# Patient Record
Sex: Male | Born: 1970 | Race: White | Hispanic: No | Marital: Single | Smoking: Former smoker
Health system: Southern US, Community
[De-identification: ages and names within clinical notes are randomized; demographics above are authoritative.]

## PROBLEM LIST (undated history)

## (undated) DIAGNOSIS — E785 Hyperlipidemia, unspecified: Secondary | ICD-10-CM

## (undated) HISTORY — PX: HEMORRHOID SURGERY: SHX153

## (undated) HISTORY — DX: Hyperlipidemia, unspecified: E78.5

## (undated) MED FILL — Dulaglutide Soln Auto-injector 3 MG/0.5ML: SUBCUTANEOUS | Fill #3 | Status: CN

---

## 2011-11-28 LAB — LIPID PANEL: Cholesterol: 196 mg/dL (ref 0–200)

## 2012-09-10 ENCOUNTER — Encounter: Payer: Self-pay | Admitting: Sports Medicine

## 2012-09-10 ENCOUNTER — Ambulatory Visit (INDEPENDENT_AMBULATORY_CARE_PROVIDER_SITE_OTHER): Payer: Managed Care, Other (non HMO) | Admitting: Sports Medicine

## 2012-09-10 VITALS — BP 133/87 | HR 92 | Temp 98.3°F | Wt 232.0 lb

## 2012-09-10 DIAGNOSIS — R059 Cough, unspecified: Secondary | ICD-10-CM

## 2012-09-10 DIAGNOSIS — Z0000 Encounter for general adult medical examination without abnormal findings: Secondary | ICD-10-CM | POA: Insufficient documentation

## 2012-09-10 DIAGNOSIS — Z299 Encounter for prophylactic measures, unspecified: Secondary | ICD-10-CM

## 2012-09-10 DIAGNOSIS — J111 Influenza due to unidentified influenza virus with other respiratory manifestations: Secondary | ICD-10-CM | POA: Insufficient documentation

## 2012-09-10 DIAGNOSIS — R509 Fever, unspecified: Secondary | ICD-10-CM

## 2012-09-10 DIAGNOSIS — R6889 Other general symptoms and signs: Secondary | ICD-10-CM

## 2012-09-10 DIAGNOSIS — R05 Cough: Secondary | ICD-10-CM

## 2012-09-10 LAB — POCT INFLUENZA A/B
Influenza A, POC: NEGATIVE
Influenza B, POC: NEGATIVE

## 2012-09-10 MED ORDER — MELOXICAM 15 MG PO TABS: ORAL_TABLET | ORAL | Status: DC

## 2012-09-10 MED ORDER — HYDROCOD POLST-CHLORPHEN POLST 10-8 MG/5ML PO LQCR: 5.0000 mL | Freq: Two times a day (BID) | ORAL | Status: DC | PRN

## 2012-09-10 NOTE — Assessment & Plan Note (Signed)
Negative rapid flu test and out of the window for Tamiflu. Treatment is symptomatic, no signs of pneumonia or sinusitis. Tussionex and Mobic Return to clinic on an as-needed basis.

## 2012-09-10 NOTE — Progress Notes (Signed)
Subjective:    CC: Establish care.   HPI:  Cough: Present for 3-4 days, nonproductive, associated with fevers up to 101, chills, and diffuse muscle and backache. He denies any sick contacts. No facial or sinus pressure, no shortness of breath, no sore throat, no GI symptoms, no rash. Cough does keep him up at night. He did get a flu shot this year.  Preventive measure: Needed biometric screening as well as some blood work done for work. He desires to wait until March and do this with his complete physical.  Past medical history, Surgical history, Family history, Social history, Allergies, and medications have been entered into the medical record, reviewed, and no changes needed.   Review of Systems: No headache, visual changes, nausea, vomiting, diarrhea, constipation, dizziness, abdominal pain, skin rash, fevers, chills, night sweats, swollen lymph nodes, weight loss, chest pain, body aches, joint swelling, muscle aches, shortness of breath, mood changes, visual or auditory hallucinations.  Objective:    General: Well Developed, well nourished, and in no acute distress.  Neuro: Alert and oriented x3, extra-ocular muscles intact, sensation grossly intact.  HEENT: Normocephalic, atraumatic, pupils equal round reactive to light, neck supple, no masses, no lymphadenopathy, thyroid nonpalpable. Oropharynx, nasopharynx, external ear canals unremarkable to inspection. No sinus tenderness to palpation. Skin: Warm and dry, no rashes noted.  Cardiac: Regular rate and rhythm, no murmurs rubs or gallops.  Respiratory: Clear to auscultation bilaterally. Not using accessory muscles, speaking in full sentences.  Abdominal: Soft, nontender, nondistended, positive bowel sounds, no masses, no organomegaly.  Musculoskeletal: Shoulder, elbow, wrist, hip, knee, ankle stable, and with full range of motion.  Impression and Recommendations:    The patient was counselled, risk factors were discussed, anticipatory  guidance given.

## 2012-09-10 NOTE — Assessment & Plan Note (Signed)
He needs a physical in March, as well as blood work. I will go ahead and give him the lab ticket he will get his blood work done, and come in for a physical in March.

## 2012-09-23 ENCOUNTER — Encounter: Payer: Self-pay | Admitting: *Deleted

## 2012-09-28 ENCOUNTER — Encounter: Payer: Self-pay | Admitting: Sports Medicine

## 2012-09-28 ENCOUNTER — Ambulatory Visit (INDEPENDENT_AMBULATORY_CARE_PROVIDER_SITE_OTHER): Payer: Managed Care, Other (non HMO) | Admitting: Sports Medicine

## 2012-09-28 ENCOUNTER — Ambulatory Visit (INDEPENDENT_AMBULATORY_CARE_PROVIDER_SITE_OTHER): Payer: Managed Care, Other (non HMO)

## 2012-09-28 VITALS — BP 132/78 | HR 107 | Temp 98.6°F | Wt 229.0 lb

## 2012-09-28 DIAGNOSIS — R5381 Other malaise: Secondary | ICD-10-CM

## 2012-09-28 DIAGNOSIS — R05 Cough: Secondary | ICD-10-CM

## 2012-09-28 DIAGNOSIS — R5383 Other fatigue: Secondary | ICD-10-CM

## 2012-09-28 DIAGNOSIS — J111 Influenza due to unidentified influenza virus with other respiratory manifestations: Secondary | ICD-10-CM

## 2012-09-28 DIAGNOSIS — R059 Cough, unspecified: Secondary | ICD-10-CM

## 2012-09-28 MED ORDER — AZITHROMYCIN 250 MG PO TABS: ORAL_TABLET | ORAL | Status: DC

## 2012-09-28 MED ORDER — HYDROCOD POLST-CHLORPHEN POLST 10-8 MG/5ML PO LQCR: 5.0000 mL | Freq: Two times a day (BID) | ORAL | Status: DC | PRN

## 2012-09-28 NOTE — Progress Notes (Signed)
Subjective:    CC: Followup  HPI: I last saw Kristopher Snyder a few weeks ago for influenza-like illness. Unfortunately he was outside of the window for Tamiflu, symmetric and symptomatically with cough medication and analgesics. He returns today significantly improved, but unfortunately still with a cough, occasionally productive of sputum but no hemoptysis. He still is slightly fatigued. He denies any shortness of breath, fevers, chills, nausea, vomiting, or diarrhea. He also denies rash. Symptoms are improving and mild.  Past medical history, Surgical history, Family history not pertinant except as noted below, Social history, Allergies, and medications have been entered into the medical record, reviewed, and no changes needed.   Review of Systems: No fevers, chills, night sweats, weight loss, chest pain, or shortness of breath.   Objective:    General: Well Developed, well nourished, and in no acute distress.  Neuro: Alert and oriented x3, extra-ocular muscles intact, sensation grossly intact.  HEENT: Normocephalic, atraumatic, pupils equal round reactive to light, neck supple, no masses, no lymphadenopathy, thyroid nonpalpable.  Skin: Warm and dry, no rashes. Cardiac: Regular rate and rhythm, no murmurs rubs or gallops.  Respiratory: Clear to auscultation bilaterally. Not using accessory muscles, speaking in full sentences.  Impression and Recommendations:

## 2012-09-28 NOTE — Assessment & Plan Note (Addendum)
FMLA paperwork filled out. Azithromycin, chest x-ray. Refilling Tussionex.

## 2012-11-24 ENCOUNTER — Other Ambulatory Visit: Payer: Self-pay | Admitting: Sports Medicine

## 2012-11-24 ENCOUNTER — Telehealth: Payer: Self-pay | Admitting: *Deleted

## 2012-11-24 DIAGNOSIS — E785 Hyperlipidemia, unspecified: Secondary | ICD-10-CM | POA: Insufficient documentation

## 2012-11-24 DIAGNOSIS — R7401 Elevation of levels of liver transaminase levels: Secondary | ICD-10-CM | POA: Insufficient documentation

## 2012-11-24 HISTORY — DX: Hyperlipidemia, unspecified: E78.5

## 2012-11-24 LAB — LIPID PANEL
Cholesterol: 203 mg/dL — ABNORMAL HIGH (ref 0–200)
HDL: 44 mg/dL (ref >39)
LDL Cholesterol: 130 mg/dL — ABNORMAL HIGH (ref 0–99)
Total CHOL/HDL Ratio: 4.6 ratio
Triglycerides: 145 mg/dL (ref <150)
VLDL: 29 mg/dL (ref 0–40)

## 2012-11-24 LAB — TESTOSTERONE, FREE, TOTAL, SHBG
Sex Hormone Binding: 27 nmol/L (ref 13–71)
Testosterone, Free: 80.3 pg/mL (ref 47.0–244.0)
Testosterone-% Free: 2.2 % (ref 1.6–2.9)
Testosterone: 358 ng/dL (ref 300–890)

## 2012-11-24 LAB — TSH: TSH: 0.633 u[IU]/mL (ref 0.350–4.500)

## 2012-11-24 LAB — COMPREHENSIVE METABOLIC PANEL
ALT: 162 U/L — ABNORMAL HIGH (ref 0–53)
Albumin: 4.1 g/dL (ref 3.5–5.2)
BUN: 17 mg/dL (ref 6–23)
CO2: 28 mEq/L (ref 19–32)
Chloride: 102 mEq/L (ref 96–112)
Creat: 0.88 mg/dL (ref 0.50–1.35)
Total Protein: 7.1 g/dL (ref 6.0–8.3)

## 2012-11-24 LAB — COMPREHENSIVE METABOLIC PANEL WITH GFR
AST: 83 U/L — ABNORMAL HIGH (ref 0–37)
Alkaline Phosphatase: 117 U/L (ref 39–117)
Calcium: 9 mg/dL (ref 8.4–10.5)
Glucose, Bld: 82 mg/dL (ref 70–99)
Potassium: 3.7 meq/L (ref 3.5–5.3)
Sodium: 138 meq/L (ref 135–145)
Total Bilirubin: 0.6 mg/dL (ref 0.3–1.2)

## 2012-11-24 LAB — CBC
HCT: 42.6 % (ref 39.0–52.0)
Hemoglobin: 14.5 g/dL (ref 13.0–17.0)
MCH: 29.7 pg (ref 26.0–34.0)
MCHC: 34 g/dL (ref 30.0–36.0)
MCV: 87.3 fL (ref 78.0–100.0)
Platelets: 187 K/uL (ref 150–400)
RBC: 4.88 MIL/uL (ref 4.22–5.81)
RDW: 13.4 % (ref 11.5–15.5)
WBC: 6.6 10*3/uL (ref 4.0–10.5)

## 2012-11-24 NOTE — Telephone Encounter (Signed)
Message copied by Edilia Bo on Tue Nov 24, 2012 11:55 AM ------      Message from: Monica Becton      Created: Tue Nov 24, 2012  8:38 AM       Labs look good, cholesterol is slightly up and liver function is elevated, need to recheck LFTs.  If still elevated need to check hepatitis titers.  Was any alcohol consumed before the labwork? ------

## 2012-11-25 ENCOUNTER — Ambulatory Visit (INDEPENDENT_AMBULATORY_CARE_PROVIDER_SITE_OTHER): Payer: BC Managed Care – PPO | Admitting: Sports Medicine

## 2012-11-25 ENCOUNTER — Encounter: Payer: Self-pay | Admitting: Sports Medicine

## 2012-11-25 VITALS — BP 136/89 | HR 75 | Ht 73.0 in | Wt 234.0 lb

## 2012-11-25 DIAGNOSIS — Z0000 Encounter for general adult medical examination without abnormal findings: Secondary | ICD-10-CM

## 2012-11-25 DIAGNOSIS — R7401 Elevation of levels of liver transaminase levels: Secondary | ICD-10-CM

## 2012-11-25 DIAGNOSIS — Z299 Encounter for prophylactic measures, unspecified: Secondary | ICD-10-CM

## 2012-11-25 DIAGNOSIS — E785 Hyperlipidemia, unspecified: Secondary | ICD-10-CM

## 2012-11-25 DIAGNOSIS — E669 Obesity, unspecified: Secondary | ICD-10-CM | POA: Insufficient documentation

## 2012-11-25 NOTE — Assessment & Plan Note (Signed)
He will work extensively on diet and exercise. We will recheck in 3 months, orders placed.

## 2012-11-25 NOTE — Progress Notes (Signed)
  Subjective:    CC: CPE  HPI:  Kristopher Snyder comes in for complete physical exam, biometry screening for work. He also comes in to go over blood work. He did have some mild transaminitis, he is seen a hepatologist in the past who recommended liver biopsy which he declined. He declines any further evaluation, her workup for this. He denies any heavy alcohol intake, he did say that he had a drink before blood work.  Past medical history, Surgical history, Family history not pertinant except as noted below, Social history, Allergies, and medications have been entered into the medical record, reviewed, and no changes needed.   Review of Systems: No headache, visual changes, nausea, vomiting, diarrhea, constipation, dizziness, abdominal pain, skin rash, fevers, chills, night sweats, swollen lymph nodes, weight loss, chest pain, body aches, joint swelling, muscle aches, shortness of breath, mood changes, visual or auditory hallucinations.  Objective:    General: Well Developed, well nourished, and in no acute distress.  Neuro: Alert and oriented x3, extra-ocular muscles intact, sensation grossly intact.  HEENT: Normocephalic, atraumatic, pupils equal round reactive to light, neck supple, no masses, no lymphadenopathy, thyroid nonpalpable. Left tympanic membrane occluded by cerumen, oropharynx, nasopharynx unremarkable. Skin: Warm and dry, no rashes noted.  Cardiac: Regular rate and rhythm, no murmurs rubs or gallops. No carotid bruits. No lower extremity edema. Respiratory: Clear to auscultation bilaterally. Not using accessory muscles, speaking in full sentences.  Abdominal: Soft, nontender, nondistended, positive bowel sounds, no masses, no organomegaly.  Musculoskeletal: Shoulder, elbow, wrist, hip, knee, ankle stable, and with full range of motion. Impression and Recommendations:    The patient was counselled, risk factors were discussed, anticipatory guidance given.

## 2012-11-25 NOTE — Assessment & Plan Note (Signed)
CPE performed today. 

## 2012-11-25 NOTE — Patient Instructions (Addendum)

## 2012-11-25 NOTE — Assessment & Plan Note (Signed)
We will work extensively on dieting and weight loss strategies. Handout given.

## 2012-11-25 NOTE — Assessment & Plan Note (Signed)
Patient declines any further workup for this. Hepatology did recommend biopsy in the past.

## 2012-12-15 ENCOUNTER — Other Ambulatory Visit: Payer: Self-pay | Admitting: Sports Medicine

## 2013-01-04 ENCOUNTER — Other Ambulatory Visit: Payer: Self-pay | Admitting: Sports Medicine

## 2013-02-03 ENCOUNTER — Ambulatory Visit (INDEPENDENT_AMBULATORY_CARE_PROVIDER_SITE_OTHER): Payer: BC Managed Care – PPO | Admitting: Family Medicine

## 2013-02-03 ENCOUNTER — Encounter: Payer: Self-pay | Admitting: Family Medicine

## 2013-02-03 VITALS — BP 125/78 | HR 97 | Temp 97.9°F | Wt 227.0 lb

## 2013-02-03 DIAGNOSIS — J209 Acute bronchitis, unspecified: Secondary | ICD-10-CM

## 2013-02-03 DIAGNOSIS — K130 Diseases of lips: Secondary | ICD-10-CM

## 2013-02-03 MED ORDER — AZITHROMYCIN 250 MG PO TABS: ORAL_TABLET | ORAL | Status: DC

## 2013-02-03 MED ORDER — PREDNISONE 20 MG PO TABS: ORAL_TABLET | ORAL | Status: AC

## 2013-02-03 NOTE — Progress Notes (Signed)
CC: Kristopher Snyder is a 42 y.o. male is here for No chief complaint on file.   Subjective: HPI:  Patient complains of productive cough, present for one week, worsening on a daily basis, moderate in severity, present all hours of the day slightly improved at night, slightly improved with Alka-Seltzer cold and sinus nothing else makes better or worse. Admits to subjective fevers today, shortness of breath mild in severity over the past 2 days. Denies chills, orthopnea, chest pain, headache, confusion, abdominal pain, nausea.  Complaint of a mass on his lower lip has been present for one week. Described as swelling without pain located on the right lower aspect of inner lip. Seems to get larger the more he chews on it. It came on abruptly without any known inciting event. Overall seems to be shrinking over the last week. Painless. Denies trouble swallowing, oral pain, dysphagia, chewing difficulty nor ulceration of the lips has never had this before  Review Of Systems Outlined In HPI  Past Medical History  Diagnosis Date  . Hyperlipidemia 11/24/2012     Family History  Problem Relation Age of Onset  . Heart disease Father   . Heart disease Paternal Grandfather      History  Substance Use Topics  . Smoking status: Never Smoker   . Smokeless tobacco: Not on file  . Alcohol Use: No     Objective: Filed Vitals:   02/03/13 1610  BP: 125/78  Pulse: 97  Temp: 97.9 F (36.6 C)    General: Alert and Oriented, No Acute Distress HEENT: Pupils equal, round, reactive to light. Conjunctivae clear.  External ears unremarkable, canals clear with intact TMs with appropriate landmarks.  Middle ear appears open without effusion. Pink inferior turbinates.  Moist mucous membranes, pharynx without inflammation nor lesions. There is a half centimeter firm nontender nodule with a blue overlying hue on the lower right medial lip, Neck supple without palpable lymphadenopathy nor abnormal masses. Lungs: Mild  central rhonchi with mild wheezing throughout without rales or signs of consolidation. Comfortable work of breathing Cardiac: Regular rate and rhythm. Normal S1/S2.  No murmurs, rubs, nor gallops.   Extremities: No peripheral edema.  Strong peripheral pulses.  Mental Status: No depression, anxiety, nor agitation. Skin: Warm and dry.  Assessment & Plan: Kristopher Snyder was seen today for no specified reason.  Diagnoses and associated orders for this visit:  Acute bronchitis - predniSONE (DELTASONE) 20 MG tablet; Three tabs at once daily for five days. - azithromycin (ZITHROMAX) 250 MG tablet; Take two tabs at once on day 1, then one tab daily on days 2-5.  Lip mass    Acute bronchitis: Start azithromycin and prednisone given wheezing. Continue as needed Alka-Seltzer cold and sinus. Urgent reevaluation Lid mass: Discussed with patient most likely hematoma that is aggravating by chewing on it, possibly mucocele, I would expect this to improve without irritating after 2 weeks, if not improved at that time call and I will be happy to place ENT referral  Return if symptoms worsen or fail to improve.            He was

## 2013-02-06 DIAGNOSIS — K219 Gastro-esophageal reflux disease without esophagitis: Secondary | ICD-10-CM | POA: Insufficient documentation

## 2013-02-08 ENCOUNTER — Telehealth: Payer: Self-pay | Admitting: *Deleted

## 2013-02-08 DIAGNOSIS — J209 Acute bronchitis, unspecified: Secondary | ICD-10-CM

## 2013-02-08 MED ORDER — LEVOFLOXACIN 500 MG PO TABS: 500.0000 mg | ORAL_TABLET | Freq: Every day | ORAL | Status: DC

## 2013-02-08 NOTE — Telephone Encounter (Signed)
Pt calls and states that he is not feeling any better.  Seen last week and pt states he feels like under water with his lungs. Still coughing white phlegm, when takes a deep breathe he wheezes, head congestion. Given steroid and antibiotic last week pt finished antibiotic yesterday and was told would be feeling better by the weekend.

## 2013-02-08 NOTE — Telephone Encounter (Signed)
[name]/[name], Will you please let Kristopher Snyder know that Levaquin was sent to [facility] pharmacy.  If no improvement by Wednesday return for f/u to determine if a chest xray is needed.

## 2013-02-09 NOTE — Telephone Encounter (Signed)
Left message on vm

## 2013-03-23 ENCOUNTER — Other Ambulatory Visit: Payer: Self-pay | Admitting: Sports Medicine

## 2013-05-04 ENCOUNTER — Ambulatory Visit (INDEPENDENT_AMBULATORY_CARE_PROVIDER_SITE_OTHER): Payer: BC Managed Care – PPO | Admitting: Sports Medicine

## 2013-05-04 ENCOUNTER — Encounter: Payer: Self-pay | Admitting: Sports Medicine

## 2013-05-04 VITALS — BP 127/80 | HR 84 | Temp 99.3°F | Wt 238.0 lb

## 2013-05-04 DIAGNOSIS — J0110 Acute frontal sinusitis, unspecified: Secondary | ICD-10-CM

## 2013-05-04 DIAGNOSIS — R040 Epistaxis: Secondary | ICD-10-CM

## 2013-05-04 MED ORDER — FLUTICASONE PROPIONATE 50 MCG/ACT NA SUSP: NASAL | Status: DC

## 2013-05-04 MED ORDER — AMOXICILLIN-POT CLAVULANATE 875-125 MG PO TABS: 1.0000 | ORAL_TABLET | Freq: Two times a day (BID) | ORAL | Status: DC

## 2013-05-04 NOTE — Assessment & Plan Note (Signed)
Flonase daily. If no better I will refer him to ENT for consideration of electrocautery.

## 2013-05-04 NOTE — Assessment & Plan Note (Signed)
Augmentin, Flonase. 

## 2013-05-04 NOTE — Progress Notes (Signed)
  Subjective:    CC: Sinus infection  HPI: For the past week started having pain and pressure behind his frontal sinuses, mild nasal discharge, low-grade fevers, no cough, no GI symptoms. He does have a history of nasal fracture, and has had sinus problems since. Symptoms are moderate, persistent.   Epistaxis: Has also been present persistently since his nasal fractures in the past, nose will bleed for 10-15 minutes at a time. Currently is not bleeding.  Past medical history, Surgical history, Family history not pertinant except as noted below, Social history, Allergies, and medications have been entered into the medical record, reviewed, and no changes needed.   Review of Systems: No fevers, chills, night sweats, weight loss, chest pain, or shortness of breath.   Objective:    General: Well Developed, well nourished, and in no acute distress.  Neuro: Alert and oriented x3, extra-ocular muscles intact, sensation grossly intact.  HEENT: Normocephalic, atraumatic, pupils equal round reactive to light, neck supple, no masses, no lymphadenopathy, thyroid nonpalpable. Oropharynx, nasopharynx, external ear canals unremarkable, tender to palpation over the frontal sinuses. Skin: Warm and dry, no rashes. Cardiac: Regular rate and rhythm, no murmurs rubs or gallops, no lower extremity edema.  Respiratory: Clear to auscultation bilaterally. Not using accessory muscles, speaking in full sentences. Impression and Recommendations:

## 2013-05-04 NOTE — Patient Instructions (Addendum)

## 2013-08-21 ENCOUNTER — Other Ambulatory Visit: Payer: Self-pay | Admitting: Sports Medicine

## 2013-10-04 ENCOUNTER — Other Ambulatory Visit: Payer: Self-pay | Admitting: Sports Medicine

## 2013-10-12 ENCOUNTER — Other Ambulatory Visit: Payer: Self-pay | Admitting: Sports Medicine

## 2013-10-12 DIAGNOSIS — R740 Nonspecific elevation of levels of transaminase and lactic acid dehydrogenase [LDH]: Principal | ICD-10-CM

## 2013-10-12 DIAGNOSIS — R7401 Elevation of levels of liver transaminase levels: Secondary | ICD-10-CM

## 2013-10-12 DIAGNOSIS — E785 Hyperlipidemia, unspecified: Secondary | ICD-10-CM

## 2013-10-13 ENCOUNTER — Other Ambulatory Visit: Payer: Self-pay | Admitting: Sports Medicine

## 2013-10-13 ENCOUNTER — Ambulatory Visit (INDEPENDENT_AMBULATORY_CARE_PROVIDER_SITE_OTHER): Payer: BC Managed Care – PPO | Admitting: Sports Medicine

## 2013-10-13 ENCOUNTER — Telehealth: Payer: Self-pay | Admitting: *Deleted

## 2013-10-13 DIAGNOSIS — R740 Nonspecific elevation of levels of transaminase and lactic acid dehydrogenase [LDH]: Principal | ICD-10-CM

## 2013-10-13 DIAGNOSIS — R7402 Elevation of levels of lactic acid dehydrogenase (LDH): Secondary | ICD-10-CM

## 2013-10-13 DIAGNOSIS — R7401 Elevation of levels of liver transaminase levels: Secondary | ICD-10-CM

## 2013-10-13 NOTE — Assessment & Plan Note (Signed)
Obtaining blood work for reevaluation of his transaminitis. Physician skill was required for the venipuncture.

## 2013-10-13 NOTE — Progress Notes (Signed)
  Subjective:    CC: Lab is unable to find a vein  HPI: This pleasant 43 year old male was downstairs having some blood work drawn, unfortunately we were unable to access the vein. He was then referred up to me for further evaluation and reattempt of the procedure. The procedure was attempted bilaterally in the cubital fossa.  Past medical history, Surgical history, Family history not pertinant except as noted below, Social history, Allergies, and medications have been entered into the medical record, reviewed, and no changes needed.   Review of Systems: No fevers, chills, night sweats, weight loss, chest pain, or shortness of breath.   Objective:    General: Well Developed, well nourished, and in no acute distress.  Neuro: Alert and oriented x3, extra-ocular muscles intact, sensation grossly intact.  HEENT: Normocephalic, atraumatic, pupils equal round reactive to light, neck supple, no masses, no lymphadenopathy, thyroid nonpalpable.  Skin: Warm and dry, no rashes. Cardiac: Regular rate and rhythm, no murmurs rubs or gallops, no lower extremity edema.  Respiratory: Clear to auscultation bilaterally. Not using accessory muscles, speaking in full sentences.  I was able to access the right dorsal venous arch of the foot, we were able to obtain 3 full SST tiger topped tubes. Physician skill was required for this venipuncture.  Impression and Recommendations:

## 2013-10-14 LAB — COMPREHENSIVE METABOLIC PANEL
ALT: 204 U/L — ABNORMAL HIGH (ref 0–53)
Albumin: 4.7 g/dL (ref 3.5–5.2)
Alkaline Phosphatase: 150 U/L — ABNORMAL HIGH (ref 39–117)
BUN: 24 mg/dL — ABNORMAL HIGH (ref 6–23)
Glucose, Bld: 76 mg/dL (ref 70–99)
Potassium: 3.7 mEq/L (ref 3.5–5.3)
Sodium: 137 mEq/L (ref 135–145)
Total Bilirubin: 0.8 mg/dL (ref 0.2–1.2)
Total Protein: 7.6 g/dL (ref 6.0–8.3)

## 2013-10-14 LAB — LIPID PANEL
Cholesterol: 204 mg/dL — ABNORMAL HIGH (ref 0–200)
HDL: 34 mg/dL — ABNORMAL LOW (ref >39)
LDL Cholesterol: 131 mg/dL — ABNORMAL HIGH (ref 0–99)
Total CHOL/HDL Ratio: 6 ratio
Triglycerides: 195 mg/dL — ABNORMAL HIGH (ref <150)
VLDL: 39 mg/dL (ref 0–40)

## 2013-10-14 LAB — COMPREHENSIVE METABOLIC PANEL WITH GFR
AST: 108 U/L — ABNORMAL HIGH (ref 0–37)
CO2: 25 meq/L (ref 19–32)
Calcium: 9.3 mg/dL (ref 8.4–10.5)
Chloride: 101 meq/L (ref 96–112)
Creat: 0.8 mg/dL (ref 0.50–1.35)

## 2013-10-14 LAB — FERRITIN: Ferritin: 284 ng/mL (ref 22–322)

## 2013-10-14 LAB — TRANSFERRIN: Transferrin: 333 mg/dL (ref 200–360)

## 2013-10-14 LAB — HEPATITIS PANEL, ACUTE
HCV Ab: NEGATIVE
Hep A IgM: NONREACTIVE
Hep B C IgM: NONREACTIVE
Hepatitis B Surface Ag: NEGATIVE

## 2013-10-15 ENCOUNTER — Encounter: Payer: Self-pay | Admitting: Sports Medicine

## 2013-10-15 ENCOUNTER — Ambulatory Visit (INDEPENDENT_AMBULATORY_CARE_PROVIDER_SITE_OTHER): Payer: BC Managed Care – PPO

## 2013-10-15 ENCOUNTER — Ambulatory Visit (INDEPENDENT_AMBULATORY_CARE_PROVIDER_SITE_OTHER): Payer: BC Managed Care – PPO | Admitting: Sports Medicine

## 2013-10-15 VITALS — BP 132/83 | HR 77 | Ht 73.0 in | Wt 236.0 lb

## 2013-10-15 DIAGNOSIS — R740 Nonspecific elevation of levels of transaminase and lactic acid dehydrogenase [LDH]: Secondary | ICD-10-CM

## 2013-10-15 DIAGNOSIS — Z299 Encounter for prophylactic measures, unspecified: Secondary | ICD-10-CM

## 2013-10-15 DIAGNOSIS — R7401 Elevation of levels of liver transaminase levels: Secondary | ICD-10-CM

## 2013-10-15 DIAGNOSIS — R7402 Elevation of levels of lactic acid dehydrogenase (LDH): Secondary | ICD-10-CM

## 2013-10-15 DIAGNOSIS — H6690 Otitis media, unspecified, unspecified ear: Secondary | ICD-10-CM

## 2013-10-15 DIAGNOSIS — Z0000 Encounter for general adult medical examination without abnormal findings: Secondary | ICD-10-CM

## 2013-10-15 DIAGNOSIS — H6692 Otitis media, unspecified, left ear: Secondary | ICD-10-CM | POA: Insufficient documentation

## 2013-10-15 LAB — ANTI-MICROSOMAL ANTIBODY LIVER / KIDNEY: LKM1 Ab: <=20 U (ref <=20.0)

## 2013-10-15 LAB — ANTI-SMOOTH MUSCLE ANTIBODY, IGG: Smooth Muscle Ab: 7 U (ref <20)

## 2013-10-15 MED ORDER — AMOXICILLIN-POT CLAVULANATE 875-125 MG PO TABS: 1.0000 | ORAL_TABLET | Freq: Two times a day (BID) | ORAL | Status: DC

## 2013-10-15 NOTE — Assessment & Plan Note (Signed)
Augmentin. Return as needed.

## 2013-10-15 NOTE — Assessment & Plan Note (Signed)
Slightly worsening transaminitis. We are still awaiting some liver tests, as well as his liver ultrasound.

## 2013-10-15 NOTE — Progress Notes (Signed)
  Subjective:    CC: Complete physical  HPI:  Preventive measure: Complete physical performed today.  Transaminitis: Has had hepatic steatohepatitis in the past, there is mildly worsening transaminitis, there are several blood tests that are still pending.  Sore throat: Localized on the left side, radiation to the ear.  Past medical history, Surgical history, Family history not pertinant except as noted below, Social history, Allergies, and medications have been entered into the medical record, reviewed, and no changes needed.   Review of Systems: No headache, visual changes, nausea, vomiting, diarrhea, constipation, dizziness, abdominal pain, skin rash, fevers, chills, night sweats, swollen lymph nodes, weight loss, chest pain, body aches, joint swelling, muscle aches, shortness of breath, mood changes, visual or auditory hallucinations.  Objective:    General: Well Developed, well nourished, and in no acute distress.  Neuro: Alert and oriented x3, extra-ocular muscles intact, sensation grossly intact.  HEENT: Normocephalic, atraumatic, pupils equal round reactive to light, neck supple, no masses, no lymphadenopathy, thyroid nonpalpable. Oropharynx, nasopharynx look normal, left external ear canal shows erythema. Skin: Warm and dry, no rashes noted.  Cardiac: Regular rate and rhythm, no murmurs rubs or gallops.  Respiratory: Clear to auscultation bilaterally. Not using accessory muscles, speaking in full sentences.  Abdominal: Soft, nontender, nondistended, positive bowel sounds, no masses, no organomegaly.  Musculoskeletal: Shoulder, elbow, wrist, hip, knee, ankle stable, and with full range of motion.  Impression and Recommendations:    The patient was counselled, risk factors were discussed, anticipatory guidance given.

## 2013-10-15 NOTE — Assessment & Plan Note (Signed)
Complete physical performed.

## 2013-10-16 LAB — ALPHA-1-ANTITRYPSIN: A-1 Antitrypsin, Ser: 174 mg/dL (ref 90–200)

## 2013-10-16 LAB — CERULOPLASMIN: Ceruloplasmin: 34 mg/dL (ref 20–60)

## 2013-11-01 ENCOUNTER — Other Ambulatory Visit: Payer: Self-pay | Admitting: Sports Medicine

## 2013-11-10 ENCOUNTER — Encounter: Payer: Self-pay | Admitting: Physician Assistant

## 2013-11-10 ENCOUNTER — Ambulatory Visit (INDEPENDENT_AMBULATORY_CARE_PROVIDER_SITE_OTHER): Payer: BC Managed Care – PPO | Admitting: Physician Assistant

## 2013-11-10 VITALS — BP 127/78 | HR 91 | Temp 98.3°F | Wt 235.0 lb

## 2013-11-10 DIAGNOSIS — Z7251 High risk heterosexual behavior: Secondary | ICD-10-CM

## 2013-11-10 DIAGNOSIS — H8110 Benign paroxysmal vertigo, unspecified ear: Secondary | ICD-10-CM

## 2013-11-10 MED ORDER — MECLIZINE HCL 50 MG PO TABS: 50.0000 mg | ORAL_TABLET | Freq: Three times a day (TID) | ORAL | Status: DC | PRN

## 2013-11-10 NOTE — Progress Notes (Signed)
   Subjective:    Patient ID: Kristopher Snyder, male    DOB: May 12, 1971, 43 y.o.   MRN: [id number]  HPI Patient is a 43 year old male who presents to the clinic with sudden onset of dizziness and lightheaded feeling. He woke up this morning and on his way to the bathroom suddenly felt very dizzy. He admits the dizziness is worse when he changes position or tries to pick something up at work. He denies any nausea, vomiting, fever, ear pain, cough or shortness of breath. He denies any chest pains or palpitations. He denies starting any new medications. He has continuous problems with his sinuses off and on. He denies any worsening of back. He denies any neck or head trauma. He does seem to be feeling better throughout the day. He has not tried anything to make better.  Patient has had some intercourse with a partner who called and said she was diagnosed with gonorrhea. He would like to be tested today. He denies any discharge that is out of the normal.   Review of Systems     Objective:   Physical Exam  Constitutional: He is oriented to person, place, and time. He appears well-developed and well-nourished.  HENT:  Head: Normocephalic and atraumatic.  Right Ear: External ear normal.  Left Ear: External ear normal.  Nose: Nose normal.  Mouth/Throat: Oropharynx is clear and moist.  TMs clear bilaterally. Negative for any maxillary or frontal sinus tenderness.  Eyes: Conjunctivae are normal. Right eye exhibits no discharge. Left eye exhibits no discharge.  Neck: Normal range of motion. Neck supple.  Pulmonary/Chest: Effort normal and breath sounds normal.  Lymphadenopathy:    He has no cervical adenopathy.  Neurological: He is alert and oriented to person, place, and time.  Positive Gilberto Betterix Hallpike to the left.  Skin: Skin is dry.  Psychiatric: He has a normal mood and affect. His behavior is normal.          Assessment & Plan:  Dizziness/BPV- orthostatic blood pressures were taken and were  negative for any orthostatic hypotension. discuss dx with patient. Treated with Antivert as needed for dizziness as well as Flonase 2 sprays each nostril once a day. Urged patient to do the Epley maneuvers to help with dizziness. Went over Teaching laboratory technicianpley maneuver and gave handout. Call if not improving.  High-risk sexual behavior-patient requests to be [facility] and chlamydia. Per patient he was tested for HIV a couple of months ago and does not want that repeated.

## 2013-11-10 NOTE — Patient Instructions (Signed)

## 2013-11-11 LAB — GC/CHLAMYDIA PROBE AMP
CT Probe RNA: NEGATIVE
GC Probe RNA: NEGATIVE

## 2014-02-22 ENCOUNTER — Other Ambulatory Visit: Payer: Self-pay | Admitting: Sports Medicine

## 2014-08-17 ENCOUNTER — Other Ambulatory Visit: Payer: Self-pay | Admitting: Sports Medicine

## 2014-09-12 ENCOUNTER — Other Ambulatory Visit: Payer: Self-pay | Admitting: Sports Medicine

## 2014-11-08 ENCOUNTER — Encounter: Payer: Self-pay | Admitting: Sports Medicine

## 2014-11-08 ENCOUNTER — Ambulatory Visit (INDEPENDENT_AMBULATORY_CARE_PROVIDER_SITE_OTHER): Payer: BLUE CROSS/BLUE SHIELD | Admitting: Sports Medicine

## 2014-11-08 DIAGNOSIS — Z0000 Encounter for general adult medical examination without abnormal findings: Secondary | ICD-10-CM

## 2014-11-08 DIAGNOSIS — E785 Hyperlipidemia, unspecified: Secondary | ICD-10-CM

## 2014-11-08 LAB — CBC
HCT: 48.1 % (ref 39.0–52.0)
Hemoglobin: 16.3 g/dL (ref 13.0–17.0)
MCH: 31.3 pg (ref 26.0–34.0)
MCHC: 33.9 g/dL (ref 30.0–36.0)
MCV: 92.3 fL (ref 78.0–100.0)
MPV: 8.9 fL (ref 8.6–12.4)
Platelets: 204 10*3/uL (ref 150–400)
RBC: 5.21 MIL/uL (ref 4.22–5.81)
RDW: 13.7 % (ref 11.5–15.5)
WBC: 5.1 10*3/uL (ref 4.0–10.5)

## 2014-11-08 LAB — COMPREHENSIVE METABOLIC PANEL
ALT: 128 U/L — ABNORMAL HIGH (ref 0–53)
Alkaline Phosphatase: 112 U/L (ref 39–117)
BUN: 19 mg/dL (ref 6–23)
CO2: 23 mEq/L (ref 19–32)
Creat: 0.76 mg/dL (ref 0.50–1.35)
Potassium: 4.1 mEq/L (ref 3.5–5.3)
Sodium: 137 mEq/L (ref 135–145)
Total Bilirubin: 0.7 mg/dL (ref 0.2–1.2)

## 2014-11-08 LAB — HEMOGLOBIN A1C
Hgb A1c MFr Bld: 5.2 % (ref <5.7)
Mean Plasma Glucose: 103 mg/dL (ref <117)

## 2014-11-08 LAB — LIPID PANEL
Cholesterol: 231 mg/dL — ABNORMAL HIGH (ref 0–200)
HDL: 41 mg/dL (ref >=40)
LDL Cholesterol: 146 mg/dL — ABNORMAL HIGH (ref 0–99)
Total CHOL/HDL Ratio: 5.6 ratio
Triglycerides: 222 mg/dL — ABNORMAL HIGH (ref <150)
VLDL: 44 mg/dL — ABNORMAL HIGH (ref 0–40)

## 2014-11-08 LAB — COMPREHENSIVE METABOLIC PANEL WITH GFR
AST: 66 U/L — ABNORMAL HIGH (ref 0–37)
Albumin: 4.2 g/dL (ref 3.5–5.2)
Calcium: 9.2 mg/dL (ref 8.4–10.5)
Chloride: 102 meq/L (ref 96–112)
Glucose, Bld: 84 mg/dL (ref 70–99)
Total Protein: 7.8 g/dL (ref 6.0–8.3)

## 2014-11-08 NOTE — Progress Notes (Signed)
  Subjective:    CC: CPE  HPI:  Kristopher Snyder is a pleasant 44 year old male with a history of steatohepatitis who comes in for a complete physical. No complaints.  Past medical history, Surgical history, Family history not pertinant except as noted below, Social history, Allergies, and medications have been entered into the medical record, reviewed, and no changes needed.   Review of Systems: No headache, visual changes, nausea, vomiting, diarrhea, constipation, dizziness, abdominal pain, skin rash, fevers, chills, night sweats, swollen lymph nodes, weight loss, chest pain, body aches, joint swelling, muscle aches, shortness of breath, mood changes, visual or auditory hallucinations.  Objective:    General: Well Developed, well nourished, and in no acute distress.  Neuro: Alert and oriented x3, extra-ocular muscles intact, sensation grossly intact. Cranial nerves II through XII are intact, motor, sensory, and coordinative functions are all intact. HEENT: Normocephalic, atraumatic, pupils equal round reactive to light, neck supple, no masses, no lymphadenopathy, thyroid nonpalpable. Oropharynx, nasopharynx, external canals were both occluded with cerumen. Skin: Warm and dry, no rashes noted.  Cardiac: Regular rate and rhythm, no murmurs rubs or gallops.  Respiratory: Clear to auscultation bilaterally. Not using accessory muscles, speaking in full sentences.  Abdominal: Soft, nontender, nondistended, positive bowel sounds, no masses, no organomegaly.  Musculoskeletal: Shoulder, elbow, wrist, hip, knee, ankle stable, and with full range of motion.  Per patient request I drew blood from the right great saphenous vein, 3 vials. This was sent downstairs.  Impression and Recommendations:    The patient was counselled, risk factors were discussed, anticipatory guidance given.

## 2014-11-08 NOTE — Assessment & Plan Note (Signed)
Routine physical as above. Filled out work form. Checking routine blood work, we are going to recheck his liver function tests considering prior transaminitis.

## 2014-11-09 MED ORDER — ATORVASTATIN CALCIUM 20 MG PO TABS: 20.0000 mg | ORAL_TABLET | Freq: Every day | ORAL | Status: DC

## 2014-11-09 NOTE — Addendum Note (Signed)
Addended by: Monica BectonHEKKEKANDAM, Colburn Asper J on: 11/09/2014 09:14 AM   Modules accepted: Orders

## 2014-11-09 NOTE — Assessment & Plan Note (Signed)
Starting low-dose Lipitor. We will keep an eye on his transaminitis, recheck in 3 months. Future orders placed for fasting labs in 3 months.

## 2015-01-16 ENCOUNTER — Other Ambulatory Visit: Payer: Self-pay | Admitting: Sports Medicine

## 2015-02-09 ENCOUNTER — Other Ambulatory Visit: Payer: Self-pay | Admitting: Sports Medicine

## 2015-04-12 ENCOUNTER — Other Ambulatory Visit: Payer: Self-pay | Admitting: Sports Medicine

## 2015-11-15 ENCOUNTER — Encounter: Payer: Self-pay | Admitting: Family Medicine

## 2015-11-15 ENCOUNTER — Ambulatory Visit (INDEPENDENT_AMBULATORY_CARE_PROVIDER_SITE_OTHER): Payer: BLUE CROSS/BLUE SHIELD | Admitting: Family Medicine

## 2015-11-15 VITALS — BP 127/105 | HR 75 | Temp 98.2°F | Wt 243.0 lb

## 2015-11-15 DIAGNOSIS — J40 Bronchitis, not specified as acute or chronic: Secondary | ICD-10-CM | POA: Insufficient documentation

## 2015-11-15 MED ORDER — AZITHROMYCIN 250 MG PO TABS
250.0000 mg | ORAL_TABLET | Freq: Every day | ORAL | Status: DC
Start: 2015-11-15 — End: 2016-01-08

## 2015-11-15 MED ORDER — IPRATROPIUM BROMIDE 0.06 % NA SOLN
2.0000 | NASAL | Status: DC | PRN
Start: 2015-11-15 — End: 2016-01-08

## 2015-11-15 MED ORDER — PREDNISONE 10 MG PO TABS: 30.0000 mg | ORAL_TABLET | Freq: Every day | ORAL | Status: DC

## 2015-11-15 NOTE — Progress Notes (Signed)
       Kristopher BaleScott Snyder is a 45 y.o. male who [facility] for cough congestion. Patient notes one and a half weeks of symptoms. He additionally notes bodyaches sneezing hoarse voice. Pressure and runny nose. His symptoms are worsening. He denies any vomiting diarrhea wheezing shortness of breath chest pain or palpitations. He has tried Flonase nasal spray and Claritin as well as some ibuprofen which have not helped much.   Past Medical History  Diagnosis Date  . Hyperlipidemia 11/24/2012   Past Surgical History  Procedure Laterality Date  . Hemorrhoid surgery     Social History  Substance Use Topics  . Smoking status: Never Smoker   . Smokeless tobacco: Not on file  . Alcohol Use: No   family history includes Heart disease in his father and paternal grandfather.  ROS as above Medications: Current Outpatient Prescriptions  Medication Sig Dispense Refill  . alprazolam (XANAX) 2 MG tablet Take 2 mg by mouth 3 (three) times daily.    Marland Kitchen. atorvastatin (LIPITOR) 20 MG tablet Take 1 tablet (20 mg total) by mouth daily. 90 tablet 3  . fluticasone (FLONASE) 50 MCG/ACT nasal spray USE 1 SPRAY IN EACH NOSTRIL TWICE DAILY. USE LEFT HAND FOR RIGHT NOSTRIL, AND RIGHT HAND FOR LEFT NOSTRIL. 16 g 11  . ibuprofen (ADVIL,MOTRIN) 800 MG tablet TAKE ONE TABLET THREE TIMES DAILY AS NEEDED 90 tablet 11  . meclizine (ANTIVERT) 50 MG tablet Take 1 tablet (50 mg total) by mouth 3 (three) times daily as needed. 30 tablet 0  . azithromycin (ZITHROMAX) 250 MG tablet Take 1 tablet (250 mg total) by mouth daily. Take first 2 tablets together, then 1 every day until finished. 6 tablet 0  . ipratropium (ATROVENT) 0.06 % nasal spray Place 2 sprays into both nostrils every 4 (four) hours as needed for rhinitis. 10 mL 6  . predniSONE (DELTASONE) 10 MG tablet Take 3 tablets (30 mg total) by mouth daily with  breakfast. 15 tablet 0   No current facility-administered medications for this visit.   No Known Allergies   Exam:  BP 127/105 mmHg  Pulse 75  Temp(Src) 98.2 F (36.8 C) (Oral)  Wt 243 lb (110.224 kg)  SpO2 96% Gen: Well NAD Nontoxic appearing HEENT: EOMI,  MMM clear nasal discharge. Posterior pharynx with cobblestoning. Ears are partially occluded by cerumen bilaterally. Lungs: Normal work of breathing. CTABL Heart: RRR no MRG Abd: NABS, Soft. Nondistended, Nontender Exts: Brisk capillary refill, warm and well perfused.   No results found for this or any previous visit (from the past 24 hour(s)). No results found.   Please see individual assessment and plan sections.

## 2015-11-15 NOTE — Patient Instructions (Signed)
Thank you for coming in today. Call or go to the emergency room if you get worse, have trouble breathing, have chest pains, or palpitations.     Acute Bronchitis Bronchitis is inflammation of the airways that extend from the windpipe into the lungs (bronchi). The inflammation often causes mucus to develop. This leads to a cough, which is the most common symptom of bronchitis.  In acute bronchitis, the condition usually develops suddenly and goes away over time, usually in a couple weeks. Smoking, allergies, and asthma can make bronchitis worse. Repeated episodes of bronchitis may cause further lung problems.  CAUSES Acute bronchitis is most often caused by the same virus that causes a cold. The virus can spread from person to person (contagious) through coughing, sneezing, and touching contaminated objects. SIGNS AND SYMPTOMS   Cough.   Fever.   Coughing up mucus.   Body aches.   Chest congestion.   Chills.   Shortness of breath.   Sore throat.  DIAGNOSIS  Acute bronchitis is usually diagnosed through a physical exam. Your health care provider will also ask you questions about your medical history. Tests, such as chest X-rays, are sometimes done to rule out other conditions.  TREATMENT  Acute bronchitis usually goes away in a couple weeks. Oftentimes, no medical treatment is necessary. Medicines are sometimes given for relief of fever or cough. Antibiotic medicines are usually not needed but may be prescribed in certain situations. In some cases, an inhaler may be recommended to help reduce shortness of breath and control the cough. A cool mist vaporizer may also be used to help thin bronchial secretions and make it easier to clear the chest.  HOME CARE INSTRUCTIONS  Get plenty of rest.   Drink enough fluids to keep your urine clear or pale yellow (unless you have a medical condition that requires fluid restriction). Increasing fluids may help thin your respiratory  secretions (sputum) and reduce chest congestion, and it will prevent dehydration.   Take medicines only as directed by your health care provider.  If you were prescribed an antibiotic medicine, finish it all even if you start to feel better.  Avoid smoking and secondhand smoke. Exposure to cigarette smoke or irritating chemicals will make bronchitis worse. If you are a smoker, consider using nicotine gum or skin patches to help control withdrawal symptoms. Quitting smoking will help your lungs heal faster.   Reduce the chances of another bout of acute bronchitis by washing your hands frequently, avoiding people with cold symptoms, and trying not to touch your hands to your mouth, nose, or eyes.   Keep all follow-up visits as directed by your health care provider.  SEEK MEDICAL CARE IF: Your symptoms do not improve after 1 week of treatment.  SEEK IMMEDIATE MEDICAL CARE IF:  You develop an increased fever or chills.   You have chest pain.   You have severe shortness of breath.  You have bloody sputum.   You develop dehydration.  You faint or repeatedly feel like you are going to pass out.  You develop repeated vomiting.  You develop a severe headache. MAKE SURE YOU:   Understand these instructions.  Will watch your condition.  Will get help right away if you are not doing well or get worse.   This information is not intended to replace advice given to you by your health care provider. Make sure you discuss any questions you have with your health care provider.   Document Released: 09/26/2004 Document Revised: 09/09/2014   Chartered certified accountant Patient Education [employer].

## 2015-11-15 NOTE — Assessment & Plan Note (Signed)
Likely bronchitis possibly sinusitis. Symptoms are most likely due to viral etiology but 10 days is a bit long for a simple cold virus. Possible secondary infection with bacteria. Treat with prednisone and azithromycin and Atrovent nasal spray. Return if not better.

## 2016-01-08 ENCOUNTER — Ambulatory Visit (INDEPENDENT_AMBULATORY_CARE_PROVIDER_SITE_OTHER): Payer: BLUE CROSS/BLUE SHIELD | Admitting: Sports Medicine

## 2016-01-08 ENCOUNTER — Encounter: Payer: Self-pay | Admitting: Sports Medicine

## 2016-01-08 VITALS — BP 134/81 | HR 88 | Resp 18 | Wt 246.2 lb

## 2016-01-08 DIAGNOSIS — S90851A Superficial foreign body, right foot, initial encounter: Secondary | ICD-10-CM

## 2016-01-08 DIAGNOSIS — B070 Plantar wart: Secondary | ICD-10-CM

## 2016-01-08 MED ORDER — HYDROCODONE-ACETAMINOPHEN 5-325 MG PO TABS: 1.0000 | ORAL_TABLET | Freq: Three times a day (TID) | ORAL | Status: DC | PRN

## 2016-01-08 NOTE — Assessment & Plan Note (Signed)
Cryo-destruction of 2 plantar warts

## 2016-01-08 NOTE — Progress Notes (Signed)
  Subjective:    CC: foreign body in foot  HPI: This is a pleasant 45 year old male, for the past several days he's noted a sharp pain under his right metatarsal heads. Pain is severe, persistent without radiation.  Plantar warts: Localized on the right foot, desires treatment today.  Past medical history, Surgical history, Family history not pertinant except as noted below, Social history, Allergies, and medications have been entered into the medical record, reviewed, and no changes needed.   Review of Systems: No fevers, chills, night sweats, weight loss, chest pain, or shortness of breath.   Objective:    General: Well Developed, well nourished, and in no acute distress.  Neuro: Alert and oriented x3, extra-ocular muscles intact, sensation grossly intact.  HEENT: Normocephalic, atraumatic, pupils equal round reactive to light, neck supple, no masses, no lymphadenopathy, thyroid nonpalpable.  Skin: Warm and dry, no rashes. Cardiac: Regular rate and rhythm, no murmurs rubs or gallops, no lower extremity edema.  Respiratory: Clear to auscultation bilaterally. Not using accessory muscles, speaking in full sentences. Right Foot: No visible erythema or swelling. Range of motion is full in all directions. Strength is 5/5 in all directions. No hallux valgus. No pes cavus or pes planus. No abnormal callus noted. No pain over the navicular prominence, or base of fifth metatarsal. No tenderness to palpation of the calcaneal insertion of plantar fascia. No pain at the Achilles insertion. No pain over the calcaneal bursa. No pain of the retrocalcaneal bursa. No tenderness to palpation over the tarsals, metatarsals, or phalanges. No hallux rigidus or limitus. No tenderness palpation over interphalangeal joints. No pain with compression of the metatarsal heads. Neurovascularly intact distally. To plantar warts visible, there is also evidence of an embedded foreign body.  Procedure:   Removal of foreign body. Risks, benefits, alternatives explained to patient. Consent obtained. Time out conducted. Noted no overlying induration or erythema at site of injection. A small amount of lidocaine infiltrated under and around the foreign body for local anesthesia. Using both sharp and blunt dissection, the foreign body, which was a piece of glass was successfully removed. Antibiotic ointment applied. Wound dressed. Advised to return if increased redness, swelling, drainage, fevers, or chills.  Procedure:  Cryodestruction of two right foot plantar warts Consent obtained and verified. Time-out conducted. Noted no overlying erythema, induration, or other signs of local infection. Completed without difficulty using Cryo-Gun. Advised to call if fevers/chills, erythema, induration, drainage, or persistent bleeding.  Impression and Recommendations:

## 2016-01-08 NOTE — Assessment & Plan Note (Signed)
Removal with  hyfrecation.

## 2016-01-08 NOTE — Addendum Note (Signed)
Addended by: Baird KayUGLAS, Shamina Etheridge M on: 01/08/2016 10:07 AM   Modules accepted: Orders, Medications

## 2016-01-12 ENCOUNTER — Encounter: Payer: Self-pay | Admitting: Sports Medicine

## 2016-01-12 ENCOUNTER — Ambulatory Visit (INDEPENDENT_AMBULATORY_CARE_PROVIDER_SITE_OTHER): Payer: Self-pay | Admitting: Sports Medicine

## 2016-01-12 VITALS — BP 121/75 | HR 91 | Resp 18 | Wt 246.0 lb

## 2016-01-12 DIAGNOSIS — S90851D Superficial foreign body, right foot, subsequent encounter: Secondary | ICD-10-CM

## 2016-01-12 NOTE — Progress Notes (Signed)
  Subjective:    CC:  Follow-up  HPI: Kristopher Snyder returns from his foreign body excision, he has FMLA paperwork that he needs me to fill out.  Past medical history, Surgical history, Family history not pertinant except as noted below, Social history, Allergies, and medications have been entered into the medical record, reviewed, and no changes needed.   Review of Systems: No fevers, chills, night sweats, weight loss, chest pain, or shortness of breath.   Objective:    General: Well Developed, well nourished, and in no acute distress.  Neuro: Alert and oriented x3, extra-ocular muscles intact, sensation grossly intact.  HEENT: Normocephalic, atraumatic, pupils equal round reactive to light, neck supple, no masses, no lymphadenopathy, thyroid nonpalpable.  Skin: Warm and dry, no rashes. Cardiac: Regular rate and rhythm, no murmurs rubs or gallops, no lower extremity edema.  Respiratory: Clear to auscultation bilaterally. Not using accessory muscles, speaking in full sentences.  FMLA paperwork filled out  Impression and Recommendations:   I spent 25 minutes with this patient, greater than 50% was face-to-face time counseling regarding the above diagnoses

## 2016-01-12 NOTE — Assessment & Plan Note (Signed)
FMLA paper work filled out 

## 2016-01-17 ENCOUNTER — Telehealth: Payer: Self-pay | Admitting: Sports Medicine

## 2016-01-17 MED ORDER — HYDROCODONE-ACETAMINOPHEN 5-325 MG PO TABS: 1.0000 | ORAL_TABLET | Freq: Three times a day (TID) | ORAL | Status: DC | PRN

## 2016-01-17 NOTE — Telephone Encounter (Signed)
Patient came by and wanted to know if you would prescribe him a few more of the pain meds for his foot.  I told him that we would let him know when it is ready.  thanks

## 2016-01-17 NOTE — Telephone Encounter (Signed)
Tiny amount given.

## 2016-03-21 DIAGNOSIS — F411 Generalized anxiety disorder: Secondary | ICD-10-CM | POA: Diagnosis not present

## 2016-04-18 ENCOUNTER — Encounter: Payer: BLUE CROSS/BLUE SHIELD | Admitting: Sports Medicine

## 2016-05-16 ENCOUNTER — Other Ambulatory Visit: Payer: Self-pay

## 2016-05-16 DIAGNOSIS — E669 Obesity, unspecified: Secondary | ICD-10-CM | POA: Diagnosis not present

## 2016-05-16 DIAGNOSIS — Z0000 Encounter for general adult medical examination without abnormal findings: Secondary | ICD-10-CM | POA: Diagnosis not present

## 2016-05-16 DIAGNOSIS — E785 Hyperlipidemia, unspecified: Secondary | ICD-10-CM | POA: Diagnosis not present

## 2016-05-16 LAB — CBC
HCT: 45.9 % (ref 38.5–50.0)
Hemoglobin: 15.9 g/dL (ref 13.2–17.1)
MCH: 31.9 pg (ref 27.0–33.0)
MCHC: 34.6 g/dL (ref 32.0–36.0)
MCV: 92.2 fL (ref 80.0–100.0)
MPV: 9.4 fL (ref 7.5–12.5)
Platelets: 173 K/uL (ref 140–400)
RBC: 4.98 MIL/uL (ref 4.20–5.80)
RDW: 12.9 % (ref 11.0–15.0)
WBC: 4.8 K/uL (ref 3.8–10.8)

## 2016-05-16 LAB — HEMOGLOBIN A1C
Hgb A1c MFr Bld: 4.8 % (ref <5.7)
Mean Plasma Glucose: 91 mg/dL

## 2016-05-16 LAB — COMPLETE METABOLIC PANEL WITHOUT GFR
Albumin: 4 g/dL (ref 3.6–5.1)
Alkaline Phosphatase: 105 U/L (ref 40–115)
CO2: 27 mmol/L (ref 20–31)
Calcium: 8.9 mg/dL (ref 8.6–10.3)
Creat: 0.79 mg/dL (ref 0.60–1.35)
GFR, Est African American: >89 mL/min (ref >=60)
GFR, Est Non African American: >89 mL/min (ref >=60)
Total Bilirubin: 0.8 mg/dL (ref 0.2–1.2)
Total Protein: 7.1 g/dL (ref 6.1–8.1)

## 2016-05-16 LAB — LIPID PANEL
Cholesterol: 176 mg/dL (ref 125–200)
HDL: 40 mg/dL (ref >=40)
LDL Cholesterol: 103 mg/dL (ref <130)
Total CHOL/HDL Ratio: 4.4 Ratio (ref <=5.0)
Triglycerides: 164 mg/dL — ABNORMAL HIGH (ref <150)
VLDL: 33 mg/dL — ABNORMAL HIGH (ref <30)

## 2016-05-16 LAB — COMPLETE METABOLIC PANEL WITH GFR
ALT: 156 U/L — ABNORMAL HIGH (ref 9–46)
AST: 88 U/L — ABNORMAL HIGH (ref 10–40)
BUN: 16 mg/dL (ref 7–25)
Chloride: 105 mmol/L (ref 98–110)
Glucose, Bld: 92 mg/dL (ref 65–99)
Potassium: 4.3 mmol/L (ref 3.5–5.3)
Sodium: 138 mmol/L (ref 135–146)

## 2016-05-16 LAB — HIV ANTIBODY (ROUTINE TESTING W REFLEX): HIV 1&2 Ab, 4th Generation: NONREACTIVE

## 2016-05-20 ENCOUNTER — Ambulatory Visit (INDEPENDENT_AMBULATORY_CARE_PROVIDER_SITE_OTHER): Payer: BLUE CROSS/BLUE SHIELD

## 2016-05-20 ENCOUNTER — Encounter: Payer: Self-pay | Admitting: Sports Medicine

## 2016-05-20 ENCOUNTER — Ambulatory Visit (INDEPENDENT_AMBULATORY_CARE_PROVIDER_SITE_OTHER): Payer: BLUE CROSS/BLUE SHIELD | Admitting: Sports Medicine

## 2016-05-20 DIAGNOSIS — R740 Nonspecific elevation of levels of transaminase and lactic acid dehydrogenase [LDH]: Secondary | ICD-10-CM

## 2016-05-20 DIAGNOSIS — M545 Low back pain: Secondary | ICD-10-CM | POA: Diagnosis not present

## 2016-05-20 DIAGNOSIS — M5030 Other cervical disc degeneration, unspecified cervical region: Secondary | ICD-10-CM | POA: Insufficient documentation

## 2016-05-20 DIAGNOSIS — M47812 Spondylosis without myelopathy or radiculopathy, cervical region: Secondary | ICD-10-CM | POA: Diagnosis not present

## 2016-05-20 DIAGNOSIS — H6120 Impacted cerumen, unspecified ear: Secondary | ICD-10-CM | POA: Insufficient documentation

## 2016-05-20 DIAGNOSIS — E785 Hyperlipidemia, unspecified: Secondary | ICD-10-CM

## 2016-05-20 DIAGNOSIS — H6123 Impacted cerumen, bilateral: Secondary | ICD-10-CM

## 2016-05-20 DIAGNOSIS — M5136 Other intervertebral disc degeneration, lumbar region: Secondary | ICD-10-CM

## 2016-05-20 DIAGNOSIS — Z0000 Encounter for general adult medical examination without abnormal findings: Secondary | ICD-10-CM

## 2016-05-20 DIAGNOSIS — M47816 Spondylosis without myelopathy or radiculopathy, lumbar region: Secondary | ICD-10-CM | POA: Diagnosis not present

## 2016-05-20 DIAGNOSIS — R7401 Elevation of levels of liver transaminase levels: Secondary | ICD-10-CM

## 2016-05-20 DIAGNOSIS — M51369 Other intervertebral disc degeneration, lumbar region without mention of lumbar back pain or lower extremity pain: Secondary | ICD-10-CM | POA: Insufficient documentation

## 2016-05-20 MED ORDER — NIACIN ER (ANTIHYPERLIPIDEMIC) 1000 MG PO TBCR: 1000.0000 mg | EXTENDED_RELEASE_TABLET | Freq: Every day | ORAL | 3 refills | Status: DC

## 2016-05-20 MED ORDER — DICLOFENAC SODIUM 75 MG PO TBEC: 75.0000 mg | DELAYED_RELEASE_TABLET | Freq: Two times a day (BID) | ORAL | 3 refills | Status: DC

## 2016-05-20 NOTE — Assessment & Plan Note (Signed)
Annual physical as above.  

## 2016-05-20 NOTE — Progress Notes (Signed)
  Subjective:    CC: CPE  HPI:  Complete physical: This is a pleasant 45 year old male. He is here for his physical.  Hyperlipidemia: Predominantly triglycerides, agrees to take niacin.  Transaminitis: Stable, steatohepatitis.  Neck pain: Radiates down the right arm.  Low back pain: Worse with sitting, flexion, Valsalva, nothing radicular.  Cerumen impaction: Bilateral.  Past medical history, Surgical history, Family history not pertinant except as noted below, Social history, Allergies, and medications have been entered into the medical record, reviewed, and no changes needed.   Review of Systems: No headache, visual changes, nausea, vomiting, diarrhea, constipation, dizziness, abdominal pain, skin rash, fevers, chills, night sweats, swollen lymph nodes, weight loss, chest pain, body aches, joint swelling, muscle aches, shortness of breath, mood changes, visual or auditory hallucinations.  Objective:    General: Well Developed, well nourished, and in no acute distress.  Neuro: Alert and oriented x3, extra-ocular muscles intact, sensation grossly intact. Cranial nerves II through XII are intact, motor, sensory, and coordinative functions are all intact. HEENT: Normocephalic, atraumatic, pupils equal round reactive to light, neck supple, no masses, no lymphadenopathy, thyroid nonpalpable. Oropharynx, nasopharynx are unremarkable. Bilateral cerumen impactions Skin: Warm and dry, no rashes noted.  Cardiac: Regular rate and rhythm, no murmurs rubs or gallops.  Respiratory: Clear to auscultation bilaterally. Not using accessory muscles, speaking in full sentences.  Abdominal: Soft, minimal right upper quadrant tenderness, nondistended, positive bowel sounds, no masses, no organomegaly.  Musculoskeletal: Shoulder, elbow, wrist, hip, knee, ankle stable, and with full range of motion.  Indication: Cerumen impaction of the left and right ear(s) Medical necessity statement: On physical  examination, cerumen impairs clinically significant portions of the external auditory canal, and tympanic membrane. Noted obstructive, copious cerumen that cannot be removed without irritation Consent: Discussed benefits and risks of procedure and verbal consent obtained Procedure: Patient was prepped for the procedure. Utilized an otoscope to assess and take note of the ear canal, the tympanic membrane, and the presence, amount, and placement of the cerumen. Gentle water irrigation was utilized to remove cerumen.  Post procedure examination: shows cerumen was completely removed. Patient tolerated procedure well. The patient is made aware that they may experience temporary vertigo, temporary hearing loss, and temporary discomfort. If these symptom last for more than 24 hours to call the clinic or proceed to the ED. Impression and Recommendations:    The patient was counselled, risk factors were discussed, anticipatory guidance given.  Annual physical exam Annual physical as above.  Transaminitis Stable, and related to hepatic steatosis.  I did recommend weight loss.  Hyperlipidemia Minimal hypertriglyceridemia. Adding niacin, recheck in 3 months.  Lumbar degenerative disc disease Axial pain, x-rays, adding Voltaren. Return in one month.  Degenerative disc disease, cervical Axial pain, x-rays, adding Voltaren. Return in one month.  Cerumen impaction Bilateral irrigation as above.

## 2016-05-20 NOTE — Assessment & Plan Note (Signed)
Axial pain, x-rays, adding Voltaren. Return in one month.

## 2016-05-20 NOTE — Assessment & Plan Note (Signed)
Stable, and related to hepatic steatosis.  I did recommend weight loss.

## 2016-05-20 NOTE — Assessment & Plan Note (Signed)
Minimal hypertriglyceridemia. Adding niacin, recheck in 3 months.

## 2016-05-20 NOTE — Assessment & Plan Note (Signed)
Bilateral irrigation as above.

## 2016-05-20 NOTE — Assessment & Plan Note (Signed)
Axial pain, x-rays, adding Voltaren. Return in one month. 

## 2016-05-23 ENCOUNTER — Encounter: Payer: BLUE CROSS/BLUE SHIELD | Admitting: Sports Medicine

## 2016-06-04 ENCOUNTER — Ambulatory Visit: Payer: BLUE CROSS/BLUE SHIELD | Admitting: Sports Medicine

## 2016-06-06 ENCOUNTER — Other Ambulatory Visit: Payer: Self-pay | Admitting: Sports Medicine

## 2016-06-06 ENCOUNTER — Telehealth: Payer: Self-pay | Admitting: Sports Medicine

## 2016-06-06 ENCOUNTER — Encounter: Payer: Self-pay | Admitting: Sports Medicine

## 2016-06-06 NOTE — Telephone Encounter (Signed)
Patient called request a release back to work letter stating he can go back on Oct 23rd he adv he already had vacation days for next week and middle of the following. Patient stated letter needs to be turned in on or before Oct 18th and if it can be faxed along with another form he is dropping off today that would be appreciated.. (Fax) [phone]. Thanks

## 2016-06-06 NOTE — Progress Notes (Signed)
Letter and forms in box.

## 2016-06-27 ENCOUNTER — Ambulatory Visit (INDEPENDENT_AMBULATORY_CARE_PROVIDER_SITE_OTHER): Payer: BLUE CROSS/BLUE SHIELD | Admitting: Sports Medicine

## 2016-06-27 ENCOUNTER — Ambulatory Visit (INDEPENDENT_AMBULATORY_CARE_PROVIDER_SITE_OTHER): Payer: BLUE CROSS/BLUE SHIELD

## 2016-06-27 ENCOUNTER — Encounter: Payer: Self-pay | Admitting: Sports Medicine

## 2016-06-27 DIAGNOSIS — M5127 Other intervertebral disc displacement, lumbosacral region: Secondary | ICD-10-CM

## 2016-06-27 DIAGNOSIS — M5136 Other intervertebral disc degeneration, lumbar region: Secondary | ICD-10-CM | POA: Diagnosis not present

## 2016-06-27 DIAGNOSIS — Z23 Encounter for immunization: Secondary | ICD-10-CM | POA: Diagnosis not present

## 2016-06-27 DIAGNOSIS — M545 Low back pain: Secondary | ICD-10-CM | POA: Diagnosis not present

## 2016-06-27 DIAGNOSIS — M51369 Other intervertebral disc degeneration, lumbar region without mention of lumbar back pain or lower extremity pain: Secondary | ICD-10-CM

## 2016-06-27 MED ORDER — TRAMADOL HCL 50 MG PO TABS: ORAL_TABLET | ORAL | 0 refills | Status: DC

## 2016-06-27 NOTE — Assessment & Plan Note (Signed)
Persistent pain despite conservative measures, adding tramadol, continue ibuprofen. Pain is predominantly discogenic, axial.  We are going to proceed with MRI for interventional planning.  Return to see me to go over results from the MRI.

## 2016-06-27 NOTE — Progress Notes (Signed)
  Subjective:    CC: Low back pain  HPI: This is a pleasant 45 year old male with lumbar degenerative disc disease and axial discogenic pain without bowel or bladder dysfunction, saddle numbness, constitutional symptoms and no trauma. We have done greater than 6 weeks of physician directed conservative measures, unfortunately he continues to have axial back pain across the low back with occasional radiation up the back, nothing overtly radicular.  Past medical history:  Negative.  See flowsheet/record as well for more information.  Surgical history: Negative.  See flowsheet/record as well for more information.  Family history: Negative.  See flowsheet/record as well for more information.  Social history: Negative.  See flowsheet/record as well for more information.  Allergies, and medications have been entered into the medical record, reviewed, and no changes needed.   Review of Systems: No fevers, chills, night sweats, weight loss, chest pain, or shortness of breath.   Objective:    General: Well Developed, well nourished, and in no acute distress.  Neuro: Alert and oriented x3, extra-ocular muscles intact, sensation grossly intact.  HEENT: Normocephalic, atraumatic, pupils equal round reactive to light, neck supple, no masses, no lymphadenopathy, thyroid nonpalpable.  Skin: Warm and dry, no rashes. Cardiac: Regular rate and rhythm, no murmurs rubs or gallops, no lower extremity edema.  Respiratory: Clear to auscultation bilaterally. Not using accessory muscles, speaking in full sentences.  Impression and Recommendations:    Lumbar degenerative disc disease Persistent pain despite conservative measures, adding tramadol, continue ibuprofen. Pain is predominantly discogenic, axial.  We are going to proceed with MRI for interventional planning.  Return to see me to go over results from the MRI.  I spent 25 minutes with this patient, greater than 50% was face-to-face time counseling  regarding the above diagnoses

## 2016-07-01 ENCOUNTER — Encounter: Payer: Self-pay | Admitting: Sports Medicine

## 2016-07-01 ENCOUNTER — Ambulatory Visit (INDEPENDENT_AMBULATORY_CARE_PROVIDER_SITE_OTHER): Payer: BLUE CROSS/BLUE SHIELD | Admitting: Sports Medicine

## 2016-07-01 DIAGNOSIS — M51369 Other intervertebral disc degeneration, lumbar region without mention of lumbar back pain or lower extremity pain: Secondary | ICD-10-CM

## 2016-07-01 DIAGNOSIS — M5136 Other intervertebral disc degeneration, lumbar region: Secondary | ICD-10-CM | POA: Diagnosis not present

## 2016-07-01 NOTE — Progress Notes (Signed)
  Subjective:    CC: Follow-up MRI results  HPI: This is a pleasant 45 year old male with lumbar ago. His pain sounds to be predominantly facetogenic now, worse with standing, extension, twisting. He really doesn't get a whole lot of pain with sitting, flexion, driving. Does have occasional increase in pain with Valsalva, coughing. No bowel or bladder dysfunction, saddle numbness, no constitutional symptoms, no radicular pain.  Past medical history:  Negative.  See flowsheet/record as well for more information.  Surgical history: Negative.  See flowsheet/record as well for more information.  Family history: Negative.  See flowsheet/record as well for more information.  Social history: Negative.  See flowsheet/record as well for more information.  Allergies, and medications have been entered into the medical record, reviewed, and no changes needed.   Review of Systems: No fevers, chills, night sweats, weight loss, chest pain, or shortness of breath.   Objective:    General: Well Developed, well nourished, and in no acute distress.  Neuro: Alert and oriented x3, extra-ocular muscles intact, sensation grossly intact.  HEENT: Normocephalic, atraumatic, pupils equal round reactive to light, neck supple, no masses, no lymphadenopathy, thyroid nonpalpable.  Skin: Warm and dry, no rashes. Cardiac: Regular rate and rhythm, no murmurs rubs or gallops, no lower extremity edema.  Respiratory: Clear to auscultation bilaterally. Not using accessory muscles, speaking in full sentences.  MRI shows mild L5-S1 loss of disc height, extrusion, and endplate edema at L5. He also has moderate facet arthritis from L2-L5 bilaterally. L5-S1 facets actually look okay.  Impression and Recommendations:    Lumbar degenerative disc disease There is a small L5-S1 protruding disc with some superior endplate edema. Prominent finding however is L2-L5 bilateral facet arthritis. I think the first interventional target  should be the bilateral L2-L3, L3-L4, and L4-L5 facets. Jaymes can simply call me if his pain returns and I'm happy to order the injections.  I spent 25 minutes with this patient, greater than 50% was face-to-face time counseling regarding the above diagnoses

## 2016-07-01 NOTE — Assessment & Plan Note (Signed)
There is a small L5-S1 protruding disc with some superior endplate edema. Prominent finding however is L2-L5 bilateral facet arthritis. I think the first interventional target should be the bilateral L2-L3, L3-L4, and L4-L5 facets. Aldan can simply call me if his pain returns and I'm happy to order the injections.

## 2016-07-03 DIAGNOSIS — M9903 Segmental and somatic dysfunction of lumbar region: Secondary | ICD-10-CM | POA: Diagnosis not present

## 2016-07-03 DIAGNOSIS — M9901 Segmental and somatic dysfunction of cervical region: Secondary | ICD-10-CM | POA: Diagnosis not present

## 2016-07-03 DIAGNOSIS — M791 Myalgia: Secondary | ICD-10-CM | POA: Diagnosis not present

## 2016-07-03 DIAGNOSIS — M5412 Radiculopathy, cervical region: Secondary | ICD-10-CM | POA: Diagnosis not present

## 2016-07-05 ENCOUNTER — Ambulatory Visit (INDEPENDENT_AMBULATORY_CARE_PROVIDER_SITE_OTHER): Payer: BLUE CROSS/BLUE SHIELD | Admitting: Sports Medicine

## 2016-07-05 ENCOUNTER — Encounter: Payer: Self-pay | Admitting: Sports Medicine

## 2016-07-05 DIAGNOSIS — M5412 Radiculopathy, cervical region: Secondary | ICD-10-CM | POA: Diagnosis not present

## 2016-07-05 DIAGNOSIS — M51369 Other intervertebral disc degeneration, lumbar region without mention of lumbar back pain or lower extremity pain: Secondary | ICD-10-CM

## 2016-07-05 DIAGNOSIS — M9901 Segmental and somatic dysfunction of cervical region: Secondary | ICD-10-CM | POA: Diagnosis not present

## 2016-07-05 DIAGNOSIS — M5136 Other intervertebral disc degeneration, lumbar region: Secondary | ICD-10-CM | POA: Diagnosis not present

## 2016-07-05 DIAGNOSIS — M9903 Segmental and somatic dysfunction of lumbar region: Secondary | ICD-10-CM | POA: Diagnosis not present

## 2016-07-05 DIAGNOSIS — M791 Myalgia: Secondary | ICD-10-CM | POA: Diagnosis not present

## 2016-07-05 NOTE — Assessment & Plan Note (Signed)
There is a small L5-S1 protruding disc with some superior endplate edema. Prominent finding however is L2-L5 bilateral facet arthritis. I think the first interventional target should be the bilateral L2-L3, L3-L4, and L4-L5 facets. Rafferty can simply call me if his pain returns and I'm happy to order the injections.  Today we simply filled out FMLA paperwork

## 2016-07-05 NOTE — Progress Notes (Signed)
  Subjective:    CC: Follow-up  HPI: Lumbar spondylosis: Overall doing well, needs FMLA paperwork filled out  Past medical history:  Negative.  See flowsheet/record as well for more information.  Surgical history: Negative.  See flowsheet/record as well for more information.  Family history: Negative.  See flowsheet/record as well for more information.  Social history: Negative.  See flowsheet/record as well for more information.  Allergies, and medications have been entered into the medical record, reviewed, and no changes needed.   Review of Systems: No fevers, chills, night sweats, weight loss, chest pain, or shortness of breath.   Objective:    General: Well Developed, well nourished, and in no acute distress.  Neuro: Alert and oriented x3, extra-ocular muscles intact, sensation grossly intact.  HEENT: Normocephalic, atraumatic, pupils equal round reactive to light, neck supple, no masses, no lymphadenopathy, thyroid nonpalpable.  Skin: Warm and dry, no rashes. Cardiac: Regular rate and rhythm, no murmurs rubs or gallops, no lower extremity edema.  Respiratory: Clear to auscultation bilaterally. Not using accessory muscles, speaking in full sentences.  I spent a great deal of time filling out FMLA paperwork  Impression and Recommendations:    Lumbar degenerative disc disease There is a small L5-S1 protruding disc with some superior endplate edema. Prominent finding however is L2-L5 bilateral facet arthritis. I think the first interventional target should be the bilateral L2-L3, L3-L4, and L4-L5 facets. Kristopher Snyder can simply call me if his pain returns and I'm happy to order the injections.  Today we simply filled out FMLA paperwork  I spent 25 minutes with this patient, greater than 50% was face-to-face time counseling regarding the above diagnoses

## 2016-07-08 DIAGNOSIS — M791 Myalgia: Secondary | ICD-10-CM | POA: Diagnosis not present

## 2016-07-08 DIAGNOSIS — M9903 Segmental and somatic dysfunction of lumbar region: Secondary | ICD-10-CM | POA: Diagnosis not present

## 2016-07-08 DIAGNOSIS — M5412 Radiculopathy, cervical region: Secondary | ICD-10-CM | POA: Diagnosis not present

## 2016-07-08 DIAGNOSIS — M9901 Segmental and somatic dysfunction of cervical region: Secondary | ICD-10-CM | POA: Diagnosis not present

## 2016-07-12 DIAGNOSIS — M791 Myalgia: Secondary | ICD-10-CM | POA: Diagnosis not present

## 2016-07-12 DIAGNOSIS — M9903 Segmental and somatic dysfunction of lumbar region: Secondary | ICD-10-CM | POA: Diagnosis not present

## 2016-07-12 DIAGNOSIS — M9901 Segmental and somatic dysfunction of cervical region: Secondary | ICD-10-CM | POA: Diagnosis not present

## 2016-07-12 DIAGNOSIS — M5412 Radiculopathy, cervical region: Secondary | ICD-10-CM | POA: Diagnosis not present

## 2016-07-17 DIAGNOSIS — M9901 Segmental and somatic dysfunction of cervical region: Secondary | ICD-10-CM | POA: Diagnosis not present

## 2016-07-17 DIAGNOSIS — M791 Myalgia: Secondary | ICD-10-CM | POA: Diagnosis not present

## 2016-07-17 DIAGNOSIS — M5412 Radiculopathy, cervical region: Secondary | ICD-10-CM | POA: Diagnosis not present

## 2016-07-17 DIAGNOSIS — M9903 Segmental and somatic dysfunction of lumbar region: Secondary | ICD-10-CM | POA: Diagnosis not present

## 2016-07-24 DIAGNOSIS — M791 Myalgia: Secondary | ICD-10-CM | POA: Diagnosis not present

## 2016-07-24 DIAGNOSIS — M9901 Segmental and somatic dysfunction of cervical region: Secondary | ICD-10-CM | POA: Diagnosis not present

## 2016-07-24 DIAGNOSIS — M9903 Segmental and somatic dysfunction of lumbar region: Secondary | ICD-10-CM | POA: Diagnosis not present

## 2016-07-24 DIAGNOSIS — M5412 Radiculopathy, cervical region: Secondary | ICD-10-CM | POA: Diagnosis not present

## 2016-08-02 DIAGNOSIS — M9901 Segmental and somatic dysfunction of cervical region: Secondary | ICD-10-CM | POA: Diagnosis not present

## 2016-08-02 DIAGNOSIS — M791 Myalgia: Secondary | ICD-10-CM | POA: Diagnosis not present

## 2016-08-02 DIAGNOSIS — M5412 Radiculopathy, cervical region: Secondary | ICD-10-CM | POA: Diagnosis not present

## 2016-08-02 DIAGNOSIS — M9903 Segmental and somatic dysfunction of lumbar region: Secondary | ICD-10-CM | POA: Diagnosis not present

## 2016-08-06 ENCOUNTER — Ambulatory Visit (INDEPENDENT_AMBULATORY_CARE_PROVIDER_SITE_OTHER): Payer: BLUE CROSS/BLUE SHIELD | Admitting: Sports Medicine

## 2016-08-06 ENCOUNTER — Encounter: Payer: Self-pay | Admitting: Sports Medicine

## 2016-08-06 ENCOUNTER — Ambulatory Visit: Payer: BLUE CROSS/BLUE SHIELD | Admitting: Sports Medicine

## 2016-08-06 DIAGNOSIS — M5136 Other intervertebral disc degeneration, lumbar region: Secondary | ICD-10-CM

## 2016-08-06 DIAGNOSIS — M51369 Other intervertebral disc degeneration, lumbar region without mention of lumbar back pain or lower extremity pain: Secondary | ICD-10-CM

## 2016-08-06 NOTE — Assessment & Plan Note (Signed)
There is L2-L5 bilateral facet arthritis, these would be the first interventional targets. Currently doing well with chiropractic manipulation, I am going to start him on a graduated return to work regimen. Return to see me on an as-needed basis.

## 2016-08-06 NOTE — Progress Notes (Signed)
  Subjective:    CC: Follow-up  HPI: Kristopher Snyder returns, he is overall doing well with regards to his back pain, we did have an MRI that showed some facet arthritis, and he has been doing well with chiropractic manipulation. At this point he simply needs a return to work letter, he would like a graduated return.  Past medical history:  Negative.  See flowsheet/record as well for more information.  Surgical history: Negative.  See flowsheet/record as well for more information.  Family history: Negative.  See flowsheet/record as well for more information.  Social history: Negative.  See flowsheet/record as well for more information.  Allergies, and medications have been entered into the medical record, reviewed, and no changes needed.   Review of Systems: No fevers, chills, night sweats, weight loss, chest pain, or shortness of breath.   Objective:    General: Well Developed, well nourished, and in no acute distress.  Neuro: Alert and oriented x3, extra-ocular muscles intact, sensation grossly intact.  HEENT: Normocephalic, atraumatic, pupils equal round reactive to light, neck supple, no masses, no lymphadenopathy, thyroid nonpalpable.  Skin: Warm and dry, no rashes. Cardiac: Regular rate and rhythm, no murmurs rubs or gallops, no lower extremity edema.  Respiratory: Clear to auscultation bilaterally. Not using accessory muscles, speaking in full sentences.  Impression and Recommendations:    Lumbar degenerative disc disease There is L2-L5 bilateral facet arthritis, these would be the first interventional targets. Currently doing well with chiropractic manipulation, I am going to start him on a graduated return to work regimen. Return to see me on an as-needed basis.  I spent 25 minutes with this patient, greater than 50% was face-to-face time counseling regarding the above diagnoses

## 2016-08-07 ENCOUNTER — Telehealth: Payer: Self-pay | Admitting: Sports Medicine

## 2016-08-07 DIAGNOSIS — M791 Myalgia: Secondary | ICD-10-CM | POA: Diagnosis not present

## 2016-08-07 DIAGNOSIS — M5412 Radiculopathy, cervical region: Secondary | ICD-10-CM | POA: Diagnosis not present

## 2016-08-07 DIAGNOSIS — M9901 Segmental and somatic dysfunction of cervical region: Secondary | ICD-10-CM | POA: Diagnosis not present

## 2016-08-07 DIAGNOSIS — M9903 Segmental and somatic dysfunction of lumbar region: Secondary | ICD-10-CM | POA: Diagnosis not present

## 2016-08-07 NOTE — Telephone Encounter (Signed)
Patient needs fmla back to work letter modified and would like to have the letter dated 08/07/16 and faxed over to his HR department. Thanks

## 2016-08-08 ENCOUNTER — Encounter: Payer: Self-pay | Admitting: Sports Medicine

## 2016-08-12 DIAGNOSIS — M5412 Radiculopathy, cervical region: Secondary | ICD-10-CM | POA: Diagnosis not present

## 2016-08-12 DIAGNOSIS — M791 Myalgia: Secondary | ICD-10-CM | POA: Diagnosis not present

## 2016-08-12 DIAGNOSIS — M9901 Segmental and somatic dysfunction of cervical region: Secondary | ICD-10-CM | POA: Diagnosis not present

## 2016-08-12 DIAGNOSIS — M9903 Segmental and somatic dysfunction of lumbar region: Secondary | ICD-10-CM | POA: Diagnosis not present

## 2016-10-04 ENCOUNTER — Telehealth: Payer: Self-pay | Admitting: Sports Medicine

## 2016-10-04 NOTE — Telephone Encounter (Signed)
Documented

## 2016-10-04 NOTE — Telephone Encounter (Signed)
Called pt got flu shot in Dec 2017 [facility]. Thanks

## 2016-10-22 DIAGNOSIS — F411 Generalized anxiety disorder: Secondary | ICD-10-CM | POA: Diagnosis not present

## 2017-04-15 DIAGNOSIS — F411 Generalized anxiety disorder: Secondary | ICD-10-CM | POA: Diagnosis not present

## 2017-04-22 DIAGNOSIS — M9904 Segmental and somatic dysfunction of sacral region: Secondary | ICD-10-CM | POA: Diagnosis not present

## 2017-04-22 DIAGNOSIS — M545 Low back pain: Secondary | ICD-10-CM | POA: Diagnosis not present

## 2017-04-22 DIAGNOSIS — M9903 Segmental and somatic dysfunction of lumbar region: Secondary | ICD-10-CM | POA: Diagnosis not present

## 2017-04-22 DIAGNOSIS — M9902 Segmental and somatic dysfunction of thoracic region: Secondary | ICD-10-CM | POA: Diagnosis not present

## 2017-04-25 DIAGNOSIS — M9903 Segmental and somatic dysfunction of lumbar region: Secondary | ICD-10-CM | POA: Diagnosis not present

## 2017-04-25 DIAGNOSIS — M9902 Segmental and somatic dysfunction of thoracic region: Secondary | ICD-10-CM | POA: Diagnosis not present

## 2017-04-25 DIAGNOSIS — M9904 Segmental and somatic dysfunction of sacral region: Secondary | ICD-10-CM | POA: Diagnosis not present

## 2017-04-25 DIAGNOSIS — M545 Low back pain: Secondary | ICD-10-CM | POA: Diagnosis not present

## 2017-04-30 DIAGNOSIS — M9902 Segmental and somatic dysfunction of thoracic region: Secondary | ICD-10-CM | POA: Diagnosis not present

## 2017-04-30 DIAGNOSIS — M9903 Segmental and somatic dysfunction of lumbar region: Secondary | ICD-10-CM | POA: Diagnosis not present

## 2017-04-30 DIAGNOSIS — M9904 Segmental and somatic dysfunction of sacral region: Secondary | ICD-10-CM | POA: Diagnosis not present

## 2017-04-30 DIAGNOSIS — M545 Low back pain: Secondary | ICD-10-CM | POA: Diagnosis not present

## 2017-05-07 DIAGNOSIS — M9903 Segmental and somatic dysfunction of lumbar region: Secondary | ICD-10-CM | POA: Diagnosis not present

## 2017-05-07 DIAGNOSIS — M545 Low back pain: Secondary | ICD-10-CM | POA: Diagnosis not present

## 2017-05-07 DIAGNOSIS — M9902 Segmental and somatic dysfunction of thoracic region: Secondary | ICD-10-CM | POA: Diagnosis not present

## 2017-05-07 DIAGNOSIS — M9904 Segmental and somatic dysfunction of sacral region: Secondary | ICD-10-CM | POA: Diagnosis not present

## 2017-07-08 DIAGNOSIS — M9903 Segmental and somatic dysfunction of lumbar region: Secondary | ICD-10-CM | POA: Diagnosis not present

## 2017-07-08 DIAGNOSIS — M545 Low back pain: Secondary | ICD-10-CM | POA: Diagnosis not present

## 2017-07-08 DIAGNOSIS — M9902 Segmental and somatic dysfunction of thoracic region: Secondary | ICD-10-CM | POA: Diagnosis not present

## 2017-07-08 DIAGNOSIS — M9904 Segmental and somatic dysfunction of sacral region: Secondary | ICD-10-CM | POA: Diagnosis not present

## 2017-07-10 ENCOUNTER — Ambulatory Visit (INDEPENDENT_AMBULATORY_CARE_PROVIDER_SITE_OTHER): Payer: BLUE CROSS/BLUE SHIELD

## 2017-07-10 ENCOUNTER — Ambulatory Visit (INDEPENDENT_AMBULATORY_CARE_PROVIDER_SITE_OTHER): Payer: BLUE CROSS/BLUE SHIELD | Admitting: Sports Medicine

## 2017-07-10 DIAGNOSIS — M25562 Pain in left knee: Secondary | ICD-10-CM | POA: Diagnosis not present

## 2017-07-10 DIAGNOSIS — M1711 Unilateral primary osteoarthritis, right knee: Secondary | ICD-10-CM | POA: Insufficient documentation

## 2017-07-10 DIAGNOSIS — G8929 Other chronic pain: Secondary | ICD-10-CM

## 2017-07-10 DIAGNOSIS — M25561 Pain in right knee: Secondary | ICD-10-CM | POA: Diagnosis not present

## 2017-07-10 DIAGNOSIS — M179 Osteoarthritis of knee, unspecified: Secondary | ICD-10-CM | POA: Diagnosis not present

## 2017-07-10 MED ORDER — DICLOFENAC SODIUM 75 MG PO TBEC: 75.0000 mg | DELAYED_RELEASE_TABLET | Freq: Two times a day (BID) | ORAL | 3 refills | Status: DC

## 2017-07-10 NOTE — Assessment & Plan Note (Signed)
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 Rehab exercises, x-rays, switching to Voltaren. Return in 1 month.

## 2017-07-10 NOTE — Progress Notes (Signed)
  Subjective:    CC: Right knee pain  HPI: This is a pleasant 46 year old male, on and off for the past several years he has had pain that he localizes the medial and lateral joint line his right knee, moderate swelling, no locking, buckling.  No trauma.  He is tried ibuprofen without much improvement.  Does get significant gelling.    Past medical history:  Negative.  See flowsheet/record as well for more information.  Surgical history: Negative.  See flowsheet/record as well for more information.  Family history: Negative.  See flowsheet/record as well for more information.  Social history: Negative.  See flowsheet/record as well for more information.  Allergies, and medications have been entered into the medical record, reviewed, and no changes needed.   Review of Systems: No fevers, chills, night sweats, weight loss, chest pain, or shortness of breath.   Objective:    General: Well Developed, well nourished, and in no acute distress.  Neuro: Alert and oriented x3, extra-ocular muscles intact, sensation grossly intact.  HEENT: Normocephalic, atraumatic, pupils equal round reactive to light, neck supple, no masses, no lymphadenopathy, thyroid nonpalpable.  Skin: Warm and dry, no rashes. Cardiac: Regular rate and rhythm, no murmurs rubs or gallops, no lower extremity edema.  Respiratory: Clear to auscultation bilaterally. Not using accessory muscles, speaking in full sentences. Left knee: Tender to palpation at the medial and lateral joint lines, swelling. ROM normal in flexion and extension and lower leg rotation. Ligaments with solid consistent endpoints including ACL, PCL, LCL, MCL. Positive McMurray sign with pain without a pop, slight pain with terminal flexion Non painful patellar compression. Patellar and quadriceps tendons unremarkable. Hamstring and quadriceps strength is normal.  Procedure: Real-time Ultrasound Guided Injection of right knee Device: GE Logiq E  Verbal  informed consent obtained.  Time-out conducted.  Noted no overlying erythema, induration, or other signs of local infection.  Skin prepped in a sterile fashion.  Local anesthesia: Topical Ethyl chloride.  With sterile technique and under real time ultrasound guidance: 1 cc kenalog 40, 2 cc lidocaine, 2 cc bupivacaine injected easily Completed without difficulty  Pain immediately resolved suggesting accurate placement of the medication.  Advised to call if fevers/chills, erythema, induration, drainage, or persistent bleeding.  Images permanently stored and available for review in the ultrasound unit.  Impression: Technically successful ultrasound guided injection.  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 Rehab exercises, x-rays, switching to Voltaren. Return in 1 month.  ___________________________________________ Ihor Austinhomas J. Benjamin Stainhekkekandam, M.D., ABFM., CAQSM. Primary Care and Sports Medicine [facility]  Adjunct Instructor of Family Medicine  [employer]

## 2017-07-11 ENCOUNTER — Telehealth: Payer: Self-pay

## 2017-07-11 NOTE — Telephone Encounter (Signed)
Spoke with pt who states he tried the diclofenac last night and today and it is not helping with his pain. Stated some of his other pains are starting to come back and he's going back to his ibuprofen but would like to know if there is anything else he could try for the pain. Please advise.

## 2017-07-11 NOTE — Telephone Encounter (Signed)
We just did the injection, ice for 20 minutes 3-4 times a day, let us discuss this again in at least 1-2 weeks.

## 2017-07-14 NOTE — Telephone Encounter (Signed)
Pt.notified

## 2017-07-21 ENCOUNTER — Other Ambulatory Visit: Payer: Self-pay | Admitting: Physician Assistant

## 2017-08-07 ENCOUNTER — Encounter: Payer: Self-pay | Admitting: Sports Medicine

## 2017-08-07 ENCOUNTER — Ambulatory Visit: Payer: BLUE CROSS/BLUE SHIELD | Admitting: Sports Medicine

## 2017-08-07 DIAGNOSIS — H6123 Impacted cerumen, bilateral: Secondary | ICD-10-CM | POA: Insufficient documentation

## 2017-08-07 DIAGNOSIS — F32A Depression, unspecified: Secondary | ICD-10-CM | POA: Insufficient documentation

## 2017-08-07 DIAGNOSIS — F411 Generalized anxiety disorder: Secondary | ICD-10-CM | POA: Diagnosis not present

## 2017-08-07 DIAGNOSIS — F419 Anxiety disorder, unspecified: Secondary | ICD-10-CM | POA: Insufficient documentation

## 2017-08-07 DIAGNOSIS — R42 Dizziness and giddiness: Secondary | ICD-10-CM | POA: Diagnosis not present

## 2017-08-07 DIAGNOSIS — H6122 Impacted cerumen, left ear: Secondary | ICD-10-CM | POA: Diagnosis not present

## 2017-08-07 MED ORDER — PREDNISONE 50 MG PO TABS: ORAL_TABLET | ORAL | 0 refills | Status: DC

## 2017-08-07 MED ORDER — MECLIZINE HCL 25 MG PO TABS: 25.0000 mg | ORAL_TABLET | Freq: Three times a day (TID) | ORAL | 3 refills | Status: DC | PRN

## 2017-08-07 NOTE — Assessment & Plan Note (Signed)
Meclizine. Burst of prednisone. Left ear irrigation. Return in 2 weeks. Some of this is likely due to his concurrent mood disorder.

## 2017-08-07 NOTE — Assessment & Plan Note (Signed)
Currently managed by mental health provider, they just give him Xanax. I did advise we could use something for controller medication. Likely Trintellix, he will let me know. No suicidal or homicidal ideation.

## 2017-08-07 NOTE — Progress Notes (Signed)
  Subjective:    CC: Vertigo  HPI: Patient is a 46 y/o male who presents for vertigo. He states that 3 days ago he became very dizzy, nauseous and felt like he was running into walls. He describes the sensation as feeling like he is going to fall over. He has not noticed anything that makes it better or worse. He reports constant tinnitus that is worse at night and diaphoresis and hot flashes when the vertigo and nausea are severe. He also reports one episode of drainage from his left ear, however experienced no pain, pressure, or injury at the time. He denies hearing loss, ear fullness, syncope, falls, vomiting, difficulty with speech, facial drooping/weakness, fever, chills, recent illness, or recent sick contacts.  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 His anxiety is being treated by a mental health provider with Xanax, which he takes PRN for symptoms. He reports maximum of 1-2 pills per day when needed, some days without any pills. Denies SI/HI.  Past medical history:  Negative.  See flowsheet/record as well for more information.  Surgical history: Negative.  See flowsheet/record as well for more information.  Family history: Negative.  See flowsheet/record as well for more information.  Social history: Negative.  See flowsheet/record as well for more information.  Allergies, and medications have been entered into the medical record, reviewed, and no changes needed.   (To billers/coders, pertinent past medical, social, surgical, family history can be found in problem list, if problem list is marked as reviewed then this indicates that past medical, social, surgical, family history was also reviewed)  Review of Systems: No fevers, chills, night sweats, weight loss, chest pain, or shortness of breath.   Objective:    General: Well Developed,  well nourished, and in no acute distress.  Neuro: Alert and oriented x3, extra-ocular muscles intact, cranial nerves and sensation grossly intact. Grip strength 5/5 bilaterally. HEENT: Normocephalic, atraumatic, pupils equal round reactive to light, EOMs normal and do not induce nystagmus, neck supple, no masses, no lymphadenopathy, thyroid nonpalpable. External, canal and TM intact on right, TM unable to be visualized on the left due to cerumen impaction. Patient reports decreased hearing on left when compared to right. Dix-Hallpike maneuver with head turned towards the left shoulder did not illicit nystagmus but reproduced severe vertigo and nausea. Skin: Warm and dry, no rashes. Cardiac: Regular rate and rhythm, no murmurs rubs or gallops, no lower extremity edema.  Respiratory: Clear to auscultation bilaterally. Not using accessory muscles, speaking in full sentences.  Impression and Recommendations:    Vertigo Meclizine. Burst of prednisone. Left ear irrigation. Return in 2 weeks. Some of this is likely due to his concurrent mood disorder.  Generalized anxiety disorder Currently managed by mental health provider, they just give him Xanax. I did advise we could use something for controller medication. Likely Trintellix, he will let me know. No suicidal or homicidal ideation.   ___________________________________________ Ihor Austinhomas J. Benjamin Stainhekkekandam, M.D., ABFM., CAQSM. Primary Care and Sports Medicine [place]  Adjunct Instructor of Family Medicine  [employer]

## 2017-08-07 NOTE — Assessment & Plan Note (Signed)
Irrigation and instrumentation required to clear this.

## 2017-08-21 ENCOUNTER — Ambulatory Visit (INDEPENDENT_AMBULATORY_CARE_PROVIDER_SITE_OTHER): Payer: BLUE CROSS/BLUE SHIELD | Admitting: Sports Medicine

## 2017-08-21 ENCOUNTER — Encounter: Payer: Self-pay | Admitting: Sports Medicine

## 2017-08-21 DIAGNOSIS — R42 Dizziness and giddiness: Secondary | ICD-10-CM

## 2017-08-21 MED ORDER — DIAZEPAM 5 MG PO TABS: 5.0000 mg | ORAL_TABLET | Freq: Three times a day (TID) | ORAL | 0 refills | Status: DC | PRN

## 2017-08-21 NOTE — Progress Notes (Signed)
  Subjective:    CC: Follow-up vertigo  HPI: This is a pleasant 46 year old male, I treated him 2 weeks ago for benign positional vertigo, he improved slightly with prednisone, meclizine.  Unfortunately continues to have some dizziness when laying flat at night, nauseous for the most part better.  Has not done any vestibular rehabilitation.  No other focal or localizing neurologic symptoms, no headaches.  No trauma, no constitutional symptoms.  Past medical history:  Negative.  See flowsheet/record as well for more information.  Surgical history: Negative.  See flowsheet/record as well for more information.  Family history: Negative.  See flowsheet/record as well for more information.  Social history: Negative.  See flowsheet/record as well for more information.  Allergies, and medications have been entered into the medical record, reviewed, and no changes needed.   (To billers/coders, pertinent past medical, social, surgical, family history can be found in problem list, if problem list is marked as reviewed then this indicates that past medical, social, surgical, family history was also reviewed)  Review of Systems: No fevers, chills, night sweats, weight loss, chest pain, or shortness of breath.   Objective:    General: Well Developed, well nourished, and in no acute distress.  Neuro: Alert and oriented x3, extra-ocular muscles intact, sensation grossly intact.  HEENT: Normocephalic, atraumatic, pupils equal round reactive to light, neck supple, no masses, no lymphadenopathy, thyroid nonpalpable.  Skin: Warm and dry, no rashes. Cardiac: Regular rate and rhythm, no murmurs rubs or gallops, no lower extremity edema.  Respiratory: Clear to auscultation bilaterally. Not using accessory muscles, speaking in full sentences.  Impression and Recommendations:    Vertigo Minor improvements in nausea, vertigo is still present, particularly with changes in head position consistent with benign  paroxysmal positional vertigo. I did explain the pathophysiology. He did not find the prednisone or the meclizine to be that helpful, switching to Valium for vestibular suppression, he will avoid Xanax when he takes this, and then going to set [place]. Return in a month. ___________________________________________ Ihor Austinhomas J. Benjamin Stainhekkekandam, M.D., ABFM., CAQSM. Primary Care and Sports Medicine [place] [facility]  Adjunct Instructor of Family Medicine  [employer]

## 2017-08-21 NOTE — Assessment & Plan Note (Signed)
Minor improvements in nausea, vertigo is still present, particularly with changes in head position consistent with benign paroxysmal positional vertigo. I did explain the pathophysiology. He did not find the prednisone or the meclizine to be that helpful, switching to Valium for vestibular suppression, he will avoid Xanax when he takes this, and then going to set him [place] with [place]. Return in a month.

## 2017-10-22 DIAGNOSIS — F411 Generalized anxiety disorder: Secondary | ICD-10-CM | POA: Diagnosis not present

## 2017-10-24 ENCOUNTER — Encounter: Payer: Self-pay | Admitting: Sports Medicine

## 2017-10-24 ENCOUNTER — Ambulatory Visit (INDEPENDENT_AMBULATORY_CARE_PROVIDER_SITE_OTHER): Payer: BLUE CROSS/BLUE SHIELD | Admitting: Sports Medicine

## 2017-10-24 DIAGNOSIS — B351 Tinea unguium: Secondary | ICD-10-CM | POA: Diagnosis not present

## 2017-10-24 DIAGNOSIS — L03032 Cellulitis of left toe: Secondary | ICD-10-CM | POA: Diagnosis not present

## 2017-10-24 MED ORDER — DOXYCYCLINE HYCLATE 100 MG PO TABS: 100.0000 mg | ORAL_TABLET | Freq: Two times a day (BID) | ORAL | 0 refills | Status: AC

## 2017-10-24 MED ORDER — TERBINAFINE HCL 250 MG PO TABS: 250.0000 mg | ORAL_TABLET | Freq: Every day | ORAL | 1 refills | Status: AC

## 2017-10-24 NOTE — Assessment & Plan Note (Signed)
Lamisil for 6 months, try to keep feet dry outside of work.

## 2017-10-24 NOTE — Assessment & Plan Note (Signed)
Lateral nail fold.   Doxycycline, return in a week.

## 2017-10-24 NOTE — Progress Notes (Signed)
  Subjective:    CC: Toe issue  HPI: This is a pleasant 47 year old male, he operates a pressure washing business.  For the past several days he has been bumping his toe on objects, he has developed some pain, partial separation of the great toenail on the left foot, drainage, redness.  In addition he is noted some thickening and yellowing of all of his toenails.  Symptoms are moderate, persistent.  I reviewed the past medical history, family history, social history, surgical history, and allergies today and no changes were needed.  Please see the problem list section below in epic for further details.  Past Medical History: Past Medical History:  Diagnosis Date  . Hyperlipidemia 11/24/2012   Past Surgical History: Past Surgical History:  Procedure Laterality Date  . HEMORRHOID SURGERY     Social History: Social History   Socioeconomic History  . Marital status: Single    Spouse name: None  . Number of children: None  . Years of education: None  . Highest education level: None  Social Needs  . Financial resource strain: None  . Food insecurity - worry: None  . Food insecurity - inability: None  . Transportation needs - medical: None  . Transportation needs - non-medical: None  Occupational History  . None  Tobacco Use  . Smoking status: Never Smoker  . Smokeless tobacco: Never Used  Substance and Sexual Activity  . Alcohol use: No  . Drug use: No  . Sexual activity: Yes    Birth control/protection: Condom  Other Topics Concern  . None  Social History Narrative  . None   Family History: Family History  Problem Relation Age of Onset  . Heart disease Father   . Heart disease Paternal Grandfather    Allergies: No Known Allergies Medications: See med rec.  Review of Systems: No fevers, chills, night sweats, weight loss, chest pain, or shortness of breath.   Objective:    General: Well Developed, well nourished, and in no acute distress.  Neuro: Alert and  oriented x3, extra-ocular muscles intact, sensation grossly intact.  HEENT: Normocephalic, atraumatic, pupils equal round reactive to light, neck supple, no masses, no lymphadenopathy, thyroid nonpalpable.  Skin: Warm and dry, no rashes. Cardiac: Regular rate and rhythm, no murmurs rubs or gallops, no lower extremity edema.  Respiratory: Clear to auscultation bilaterally. Not using accessory muscles, speaking in full sentences. Left foot: Great toenail is thickened, yellow with discharge, partially separated, small subungual hematoma. The other nails are slightly thickened and slightly yellow. Range of motion is full in all directions. Strength is 5/5 in all directions. No hallux valgus. No pes cavus or pes planus. No abnormal callus noted. No pain over the navicular prominence, or base of fifth metatarsal. No tenderness to palpation of the calcaneal insertion of plantar fascia. No pain at the Achilles insertion. No pain over the calcaneal bursa. No pain of the retrocalcaneal bursa. No tenderness to palpation over the tarsals, metatarsals, or phalanges. No hallux rigidus or limitus. No tenderness palpation over interphalangeal joints. No pain with compression of the metatarsal heads. Neurovascularly intact distally.  Impression and Recommendations:    Onychomycosis Lamisil for 6 months, try to keep feet dry outside of work.  Paronychia of great toe, left Lateral nail fold.   Doxycycline, return in a week. ___________________________________________ Ihor Austinhomas J. Benjamin Stainhekkekandam, M.D., ABFM., CAQSM. Primary Care and Sports Medicine [place] [facility]  Adjunct Instructor of Family Medicine  [employer]

## 2017-12-18 ENCOUNTER — Telehealth: Payer: Self-pay | Admitting: Sports Medicine

## 2017-12-18 DIAGNOSIS — Z0000 Encounter for general adult medical examination without abnormal findings: Secondary | ICD-10-CM

## 2017-12-18 DIAGNOSIS — E785 Hyperlipidemia, unspecified: Secondary | ICD-10-CM

## 2017-12-18 NOTE — Telephone Encounter (Signed)
Pt called. He has his Physical scheduled for 4/24 and wants to come in on 4/22 to have bloodwork done. Please send Orders down for 4/22.

## 2017-12-18 NOTE — Telephone Encounter (Signed)
Basic labs ordered. Pt advised.

## 2017-12-18 NOTE — Telephone Encounter (Signed)
Preesh! 

## 2017-12-22 DIAGNOSIS — Z0000 Encounter for general adult medical examination without abnormal findings: Secondary | ICD-10-CM | POA: Diagnosis not present

## 2017-12-22 DIAGNOSIS — E785 Hyperlipidemia, unspecified: Secondary | ICD-10-CM | POA: Diagnosis not present

## 2017-12-23 LAB — LIPID PANEL
Cholesterol: 222 mg/dL — ABNORMAL HIGH (ref <200)
HDL: 43 mg/dL (ref >40)
LDL Cholesterol (Calc): 144 mg/dL — ABNORMAL HIGH
Non-HDL Cholesterol (Calc): 179 mg/dL (calc) — ABNORMAL HIGH (ref <130)
Total CHOL/HDL Ratio: 5.2 (calc) — ABNORMAL HIGH (ref <5.0)
Triglycerides: 211 mg/dL — ABNORMAL HIGH (ref <150)

## 2017-12-23 LAB — CBC WITH DIFFERENTIAL/PLATELET
Basophils Absolute: 58 cells/uL (ref 0–200)
Basophils Relative: 0.9 %
Eosinophils Absolute: 122 {cells}/uL (ref 15–500)
Eosinophils Relative: 1.9 %
HCT: 47.8 % (ref 38.5–50.0)
Hemoglobin: 16.6 g/dL (ref 13.2–17.1)
Lymphs Abs: 1389 cells/uL (ref 850–3900)
MCH: 32.1 pg (ref 27.0–33.0)
MCHC: 34.7 g/dL (ref 32.0–36.0)
MCV: 92.5 fL (ref 80.0–100.0)
MPV: 10.4 fL (ref 7.5–12.5)
Monocytes Relative: 9.5 %
Neutro Abs: 4224 cells/uL (ref 1500–7800)
Neutrophils Relative %: 66 %
Platelets: 189 Thousand/uL (ref 140–400)
RBC: 5.17 10*6/uL (ref 4.20–5.80)
RDW: 12.6 % (ref 11.0–15.0)
Total Lymphocyte: 21.7 %
WBC mixed population: 608 cells/uL (ref 200–950)
WBC: 6.4 10*3/uL (ref 3.8–10.8)

## 2017-12-23 LAB — HEMOGLOBIN A1C
Hgb A1c MFr Bld: 5 % of total Hgb (ref <5.7)
Mean Plasma Glucose: 97 (calc)
eAG (mmol/L): 5.4 (calc)

## 2017-12-23 LAB — COMPLETE METABOLIC PANEL WITH GFR
AG Ratio: 1.2 (calc) (ref 1.0–2.5)
Albumin: 4.2 g/dL (ref 3.6–5.1)
BUN: 15 mg/dL (ref 7–25)
CO2: 27 mmol/L (ref 20–32)
Calcium: 9.2 mg/dL (ref 8.6–10.3)
Creat: 0.86 mg/dL (ref 0.60–1.35)
GFR, Est African American: 120 mL/min/{1.73_m2} (ref >=60)
GFR, Est Non African American: 103 mL/min/{1.73_m2} (ref >=60)
Globulin: 3.4 g/dL (calc) (ref 1.9–3.7)
Glucose, Bld: 110 mg/dL — ABNORMAL HIGH (ref 65–99)
Potassium: 4.4 mmol/L (ref 3.5–5.3)
Sodium: 138 mmol/L (ref 135–146)
Total Protein: 7.6 g/dL (ref 6.1–8.1)

## 2017-12-23 LAB — COMPLETE METABOLIC PANEL WITHOUT GFR
ALT: 83 U/L — ABNORMAL HIGH (ref 9–46)
AST: 54 U/L — ABNORMAL HIGH (ref 10–40)
Alkaline phosphatase (APISO): 96 U/L (ref 40–115)
Chloride: 104 mmol/L (ref 98–110)
Total Bilirubin: 0.6 mg/dL (ref 0.2–1.2)

## 2017-12-24 ENCOUNTER — Encounter: Payer: BLUE CROSS/BLUE SHIELD | Admitting: Sports Medicine

## 2017-12-26 ENCOUNTER — Encounter: Payer: Self-pay | Admitting: Sports Medicine

## 2017-12-26 ENCOUNTER — Ambulatory Visit (INDEPENDENT_AMBULATORY_CARE_PROVIDER_SITE_OTHER): Payer: BLUE CROSS/BLUE SHIELD | Admitting: Sports Medicine

## 2017-12-26 VITALS — BP 125/77 | HR 67 | Resp 18 | Ht 72.5 in | Wt 243.0 lb

## 2017-12-26 DIAGNOSIS — L03032 Cellulitis of left toe: Secondary | ICD-10-CM

## 2017-12-26 DIAGNOSIS — F411 Generalized anxiety disorder: Secondary | ICD-10-CM | POA: Diagnosis not present

## 2017-12-26 DIAGNOSIS — B351 Tinea unguium: Secondary | ICD-10-CM | POA: Diagnosis not present

## 2017-12-26 DIAGNOSIS — K644 Residual hemorrhoidal skin tags: Secondary | ICD-10-CM | POA: Diagnosis not present

## 2017-12-26 DIAGNOSIS — R740 Nonspecific elevation of levels of transaminase and lactic acid dehydrogenase [LDH]: Secondary | ICD-10-CM

## 2017-12-26 DIAGNOSIS — L719 Rosacea, unspecified: Secondary | ICD-10-CM | POA: Insufficient documentation

## 2017-12-26 DIAGNOSIS — E785 Hyperlipidemia, unspecified: Secondary | ICD-10-CM

## 2017-12-26 DIAGNOSIS — R7401 Elevation of levels of liver transaminase levels: Secondary | ICD-10-CM

## 2017-12-26 DIAGNOSIS — Z0000 Encounter for general adult medical examination without abnormal findings: Secondary | ICD-10-CM

## 2017-12-26 MED ORDER — ESCITALOPRAM OXALATE 10 MG PO TABS: 10.0000 mg | ORAL_TABLET | Freq: Every day | ORAL | 3 refills | Status: DC

## 2017-12-26 MED ORDER — HYDROCORTISONE ACETATE 25 MG RE SUPP: 25.0000 mg | Freq: Two times a day (BID) | RECTAL | 2 refills | Status: DC | PRN

## 2017-12-26 MED ORDER — METRONIDAZOLE 0.75 % EX GEL: 1.0000 "application " | Freq: Two times a day (BID) | CUTANEOUS | 3 refills | Status: DC

## 2017-12-26 NOTE — Assessment & Plan Note (Signed)
Recheck in a couple of months.  This actually improved.

## 2017-12-26 NOTE — Assessment & Plan Note (Signed)
Patient will increase fiber, and eat a more fish-based diet, recheck in 3 months.

## 2017-12-26 NOTE — Assessment & Plan Note (Signed)
Resolved with doxycycline  

## 2017-12-26 NOTE — Assessment & Plan Note (Signed)
New nail is starting to grow out, he will finish his current course of Lamisil.

## 2017-12-26 NOTE — Progress Notes (Signed)
Subjective:    CC: Annual physical  HPI:  Kristopher Snyder is here for a physical, he does have several complaints.  Anxiety: Worsening, historically only takes an occasional alprazolam, now notes daily anxiety, stress.  No suicidal or homicidal ideation.  Onychomycosis: Improving after several months of Lamisil, new toenail is growing out.  Hemorrhoids: No bleeding, but he does feel a palpable nodule that is somewhat irritated.  Does not really strain with stooling, would like a suppository to help soothe the hemorrhoids.  I reviewed the past medical history, family history, social history, surgical history, and allergies today and no changes were needed.  Please see the problem list section below in epic for further details.  Past Medical History: Past Medical History:  Diagnosis Date  . Hyperlipidemia 11/24/2012   Past Surgical History: Past Surgical History:  Procedure Laterality Date  . HEMORRHOID SURGERY     Social History: Social History   Socioeconomic History  . Marital status: Single    Spouse name: Not on file  . Number of children: Not on file  . Years of education: Not on file  . Highest education level: Not on file  Occupational History  . Not on file  Social Needs  . Financial resource strain: Not on file  . Food insecurity:    Worry: Not on file    Inability: Not on file  . Transportation needs:    Medical: Not on file    Non-medical: Not on file  Tobacco Use  . Smoking status: Never Smoker  . Smokeless tobacco: Never Used  Substance and Sexual Activity  . Alcohol use: No  . Drug use: No  . Sexual activity: Yes    Birth control/protection: Condom  Lifestyle  . Physical activity:    Days per week: Not on file    Minutes per session: Not on file  . Stress: Not on file  Relationships  . Social connections:    Talks on phone: Not on file    Gets together: Not on file    Attends religious service: Not on file    Active member of club or organization: Not  on file    Attends meetings of clubs or organizations: Not on file    Relationship status: Not on file  Other Topics Concern  . Not on file  Social History Narrative  . Not on file   Family History: Family History  Problem Relation Age of Onset  . Heart disease Father   . Heart disease Paternal Grandfather    Allergies: No Known Allergies Medications: See med rec.  Review of Systems: No headache, visual changes, nausea, vomiting, diarrhea, constipation, dizziness, abdominal pain, skin rash, fevers, chills, night sweats, swollen lymph nodes, weight loss, chest pain, body aches, joint swelling, muscle aches, shortness of breath, mood changes, visual or auditory hallucinations.  Objective:    General: Well Developed, well nourished, and in no acute distress.  Neuro: Alert and oriented x3, extra-ocular muscles intact, sensation grossly intact. Cranial nerves II through XII are intact, motor, sensory, and coordinative functions are all intact. HEENT: Normocephalic, atraumatic, pupils equal round reactive to light, neck supple, no masses, no lymphadenopathy, thyroid nonpalpable. Oropharynx, nasopharynx, external ear canals are unremarkable. Skin: Warm and dry, erythematous, papular rash over the nose and cheeks consistent with papulopustular rosacea Cardiac: Regular rate and rhythm, no murmurs rubs or gallops.  Respiratory: Clear to auscultation bilaterally. Not using accessory muscles, speaking in full sentences.  Abdominal: Soft, nontender, nondistended, positive bowel sounds, no  masses, no organomegaly.  Musculoskeletal: Shoulder, elbow, wrist, hip, knee, ankle stable, and with full range of motion.  New toenail growth is noted, approximately 50% of the thickened yellow nail has now grown out.  Impression and Recommendations:    The patient was counselled, risk factors were discussed, anticipatory guidance given.  Annual physical exam Routine physical as  above.  Rosacea Papulopustular, adding topical MetroGel  External hemorrhoids Increase fiber in diet, Anusol suppositories. We will check this next time to ensure no evidence of a perianal neoplasm.  Paronychia of great toe, left Resolved with doxycycline  Onychomycosis New nail is starting to grow out, he will finish his current course of Lamisil.  Generalized anxiety disorder Xanax is insufficiently controlling his symptoms, adding Lexapro. Return in 1 month for PHQ/GAD  Hyperlipidemia Patient will increase fiber, and eat a more fish-based diet, recheck in 3 months.  Transaminitis Recheck in a couple of months.  This actually improved.  ___________________________________________ [name]. Benjamin Stain, M.D., ABFM., CAQSM. Primary Care and Sports Medicine [facility] [facility]  Adjunct Instructor of Family Medicine  [employer]

## 2017-12-26 NOTE — Assessment & Plan Note (Signed)
Papulopustular, adding topical MetroGel

## 2017-12-26 NOTE — Assessment & Plan Note (Signed)
Routine physical as above. 

## 2017-12-26 NOTE — Assessment & Plan Note (Signed)
Increase fiber in diet, Anusol suppositories. We will check this next time to ensure no evidence of a perianal neoplasm.

## 2017-12-26 NOTE — Assessment & Plan Note (Signed)
Xanax is insufficiently controlling his symptoms, adding Lexapro. Return in 1 month for PHQ/GAD

## 2018-02-16 ENCOUNTER — Encounter: Payer: Self-pay | Admitting: Family Medicine

## 2018-02-16 ENCOUNTER — Ambulatory Visit: Payer: BLUE CROSS/BLUE SHIELD | Admitting: Family Medicine

## 2018-02-16 VITALS — BP 131/90 | HR 84 | Temp 97.8°F | Wt 239.0 lb

## 2018-02-16 DIAGNOSIS — J0100 Acute maxillary sinusitis, unspecified: Secondary | ICD-10-CM

## 2018-02-16 DIAGNOSIS — R05 Cough: Secondary | ICD-10-CM

## 2018-02-16 DIAGNOSIS — R059 Cough, unspecified: Secondary | ICD-10-CM

## 2018-02-16 MED ORDER — GUAIFENESIN-CODEINE 100-10 MG/5ML PO SOLN: 5.0000 mL | Freq: Four times a day (QID) | ORAL | 0 refills | Status: DC | PRN

## 2018-02-16 MED ORDER — AZITHROMYCIN 250 MG PO TABS: 250.0000 mg | ORAL_TABLET | Freq: Every day | ORAL | 0 refills | Status: DC

## 2018-02-16 MED ORDER — PREDNISONE 10 MG PO TABS: 30.0000 mg | ORAL_TABLET | Freq: Every day | ORAL | 0 refills | Status: DC

## 2018-02-16 MED ORDER — ALBUTEROL SULFATE HFA 108 (90 BASE) MCG/ACT IN AERS: 2.0000 | INHALATION_SPRAY | Freq: Four times a day (QID) | RESPIRATORY_TRACT | 0 refills | Status: DC | PRN

## 2018-02-16 MED ORDER — IPRATROPIUM-ALBUTEROL 0.5-2.5 (3) MG/3ML IN SOLN
3.0000 mL | Freq: Once | RESPIRATORY_TRACT | Status: AC
  Administered 2018-02-16: 3 mL via RESPIRATORY_TRACT

## 2018-02-16 NOTE — Progress Notes (Signed)
Kristopher Snyder is a 47 y.o. male who [facility] Primary [facility] Medicine today for sinus pain and pressure cough congestion and wheezing sore throat.  Symptoms present for 5 to 6 days.  Patient initially noted fevers and chills but none since.  He notes bothersome postnasal drainage and wheezing.  He is tried some over-the-counter medications which have not helped much at all.  He denies chest pain palpitations or severe shortness of breath.  He denies severe exertional symptoms.  He notes cough is moderate and occasionally productive and does interfere with sleep.  He notes that he smoked for 10 to 20 years about 20 years ago.  He denies a personal history of asthma.   ROS as above:  Exam:  BP 131/90   Pulse 84   Temp 97.8 F (36.6 C) (Oral)   Wt 239 lb (108.4 kg)   SpO2 95%   BMI 31.97 kg/m  Gen: Well NAD HEENT: EOMI,  MMM posterior pharynx with cobblestoning.  Normal tympanic membranes.  Clear nasal discharge.  No cervical lymphadenopathy. Lungs: Normal work of breathing.  Wheezing and prolonged expiratory phase and coarse breath sounds bilaterally. Heart: RRR no MRG Abd: NABS, Soft. Nondistended, Nontender Exts: Brisk capillary refill, warm and well perfused.   Patient was given a 2.5/0.5 mg DuoNeb nebulizer treatment and felt much better.  His lung exam normalized.   Assessment and Plan: 47 y.o. male with bronchitis and sinusitis.  Plan to treat with albuterol, prednisone, and codeine cough syrup at bedtime as needed.  Azithromycin as back-up.  Recheck as needed.  Return sooner if needed. [place] [facility] [place] [facility].   No orders of the defined types were placed in this encounter.  Meds ordered this encounter  Medications  . ipratropium-albuterol (DUONEB) 0.5-2.5 (3) MG/3ML nebulizer solution 3 mL  . DISCONTD: predniSONE  (DELTASONE) 10 MG tablet    Sig: Take 3 tablets (30 mg total) by mouth daily with breakfast.    Dispense:  15 tablet    Refill:  0  . DISCONTD: azithromycin (ZITHROMAX) 250 MG tablet    Sig: Take 1 tablet (250 mg total) by mouth daily. Take first 2 tablets together, then 1 every day until finished.    Dispense:  6 tablet    Refill:  0  . DISCONTD: albuterol (PROVENTIL HFA;VENTOLIN HFA) 108 (90 Base) MCG/ACT inhaler    Sig: Inhale 2 puffs into the lungs every 6 (six) hours as needed for wheezing or shortness of breath.    Dispense:  1 Inhaler    Refill:  0  . DISCONTD: guaiFENesin-codeine 100-10 MG/5ML syrup    Sig: Take 5 mLs by mouth every 6 (six) hours as needed for cough.    Dispense:  120 mL    Refill:  0  . predniSONE (DELTASONE) 10 MG tablet    Sig: Take 3 tablets (30 mg total) by mouth daily with breakfast.    Dispense:  15 tablet    Refill:  0  . azithromycin (ZITHROMAX) 250 MG tablet    Sig: Take 1 tablet (250 mg total) by mouth daily. Take first 2 tablets together, then 1 every day until finished.    Dispense:  6 tablet    Refill:  0  . albuterol (PROVENTIL HFA;VENTOLIN HFA) 108 (90 Base) MCG/ACT inhaler    Sig: Inhale 2 puffs into the lungs every 6 (six) hours as needed for wheezing or shortness of  breath.    Dispense:  1 Inhaler    Refill:  0  . guaiFENesin-codeine 100-10 MG/5ML syrup    Sig: Take 5 mLs by mouth every 6 (six) hours as needed for cough.    Dispense:  120 mL    Refill:  0     Historical information moved to improve visibility of documentation.  Past Medical History:  Diagnosis Date  . Hyperlipidemia 11/24/2012   Past Surgical History:  Procedure Laterality Date  . HEMORRHOID SURGERY     Social History   Tobacco Use  . Smoking status: Former Smoker    Packs/day: 1.00    Years: 20.00    Pack years: 20.00    Types: Cigarettes    Start date: 09/02/1984    Last attempt to quit: 09/02/2004    Years since quitting: 13.4  . Smokeless tobacco: Never  Used  Substance Use Topics  . Alcohol use: No   family history includes Heart disease in his father and paternal grandfather.  Medications: Current Outpatient Medications  Medication Sig Dispense Refill  . alprazolam (XANAX) 2 MG tablet Take 2 mg by mouth 3 (three) times daily.    Marland Kitchen escitalopram (LEXAPRO) 10 MG tablet Take 1 tablet (10 mg total) by mouth daily. 30 tablet 3  . hydrocortisone (ANUSOL-HC) 25 MG suppository Place 1 suppository (25 mg total) rectally 2 (two) times daily as needed for hemorrhoids. 24 suppository 2  . IBU 800 MG tablet TAKE ONE TABLET THREE TIMES DAILY AS NEEDED 90 tablet 11  . metroNIDAZOLE (METROGEL) 0.75 % gel Apply 1 application topically 2 (two) times daily. 45 g 3  . albuterol (PROVENTIL HFA;VENTOLIN HFA) 108 (90 Base) MCG/ACT inhaler Inhale 2 puffs into the lungs every 6 (six) hours as needed for wheezing or shortness of breath. 1 Inhaler 0  . azithromycin (ZITHROMAX) 250 MG tablet Take 1 tablet (250 mg total) by mouth daily. Take first 2 tablets together, then 1 every day until finished. 6 tablet 0  . guaiFENesin-codeine 100-10 MG/5ML syrup Take 5 mLs by mouth every 6 (six) hours as needed for cough. 120 mL 0  . predniSONE (DELTASONE) 10 MG tablet Take 3 tablets (30 mg total) by mouth daily with breakfast. 15 tablet 0   No current facility-administered medications for this visit.    No Known Allergies  Health Maintenance Health Maintenance  Topic Date Due  . INFLUENZA VACCINE  04/02/2018  . TETANUS/TDAP  11/21/2024  . HIV Screening  Completed    Discussed warning signs or symptoms. Please see discharge instructions. Patient expresses understanding.

## 2018-02-16 NOTE — Patient Instructions (Signed)
Thank you for coming in today. Take the prednisone and zpack antibiotic daily for 5 days.  Use the albuterol inhaler as needed.  Use the cough medicine as needed mostly at bedtime.  Ok to work if you feel ok.   Recheck with me as needed.   Call or go to the emergency room if you get worse, have trouble breathing, have chest pains, or palpitations.    Acute Bronchitis, Adult Acute bronchitis is when air tubes (bronchi) in the lungs suddenly get swollen. The condition can make it hard to breathe. It can also cause these symptoms:  A cough.  Coughing up clear, yellow, or green mucus.  Wheezing.  Chest congestion.  Shortness of breath.  A fever.  Body aches.  Chills.  A sore throat.  Follow these instructions at home: Medicines  Take over-the-counter and prescription medicines only as told by your doctor.  If you were prescribed an antibiotic medicine, take it as told by your doctor. Do not stop taking the antibiotic even if you start to feel better. General instructions  Rest.  Drink enough fluids to keep your pee (urine) clear or pale yellow.  Avoid smoking and secondhand smoke. If you smoke and you need help quitting, ask your doctor. Quitting will help your lungs heal faster.  Use an inhaler, cool mist vaporizer, or humidifier as told by your doctor.  Keep all follow-up visits as told by your doctor. This is important. How is this prevented? To lower your risk of getting this condition again:  Wash your hands often with soap and water. If you cannot use soap and water, use hand sanitizer.  Avoid contact with people who have cold symptoms.  Try not to touch your hands to your mouth, nose, or eyes.  Make sure to get the flu shot every year.  Contact a doctor if:  Your symptoms do not get better in 2 weeks. Get help right away if:  You cough up blood.  You have chest pain.  You have very bad shortness of breath.  You become dehydrated.  You faint  (pass out) or keep feeling like you are going to pass out.  You keep throwing up (vomiting).  You have a very bad headache.  Your fever or chills gets worse. This information is not intended to replace advice given to you by your health care provider. Make sure you discuss any questions you have with your health care provider. Document Released: 02/05/2008 Document Revised: 03/27/2016 Document Reviewed: 02/07/2016 Elsevier Interactive Patient Education  [employer]2018 Else[employer]

## 2018-03-18 ENCOUNTER — Other Ambulatory Visit: Payer: Self-pay | Admitting: Family Medicine

## 2018-03-24 DIAGNOSIS — M9902 Segmental and somatic dysfunction of thoracic region: Secondary | ICD-10-CM | POA: Diagnosis not present

## 2018-03-24 DIAGNOSIS — M9904 Segmental and somatic dysfunction of sacral region: Secondary | ICD-10-CM | POA: Diagnosis not present

## 2018-03-24 DIAGNOSIS — M545 Low back pain: Secondary | ICD-10-CM | POA: Diagnosis not present

## 2018-03-24 DIAGNOSIS — M9903 Segmental and somatic dysfunction of lumbar region: Secondary | ICD-10-CM | POA: Diagnosis not present

## 2018-04-01 DIAGNOSIS — M9904 Segmental and somatic dysfunction of sacral region: Secondary | ICD-10-CM | POA: Diagnosis not present

## 2018-04-01 DIAGNOSIS — M545 Low back pain: Secondary | ICD-10-CM | POA: Diagnosis not present

## 2018-04-01 DIAGNOSIS — M9902 Segmental and somatic dysfunction of thoracic region: Secondary | ICD-10-CM | POA: Diagnosis not present

## 2018-04-01 DIAGNOSIS — M9903 Segmental and somatic dysfunction of lumbar region: Secondary | ICD-10-CM | POA: Diagnosis not present

## 2018-05-14 DIAGNOSIS — F411 Generalized anxiety disorder: Secondary | ICD-10-CM | POA: Diagnosis not present

## 2018-05-18 ENCOUNTER — Ambulatory Visit (INDEPENDENT_AMBULATORY_CARE_PROVIDER_SITE_OTHER): Payer: BLUE CROSS/BLUE SHIELD | Admitting: Sports Medicine

## 2018-05-18 ENCOUNTER — Encounter: Payer: Self-pay | Admitting: Sports Medicine

## 2018-05-18 DIAGNOSIS — M5136 Other intervertebral disc degeneration, lumbar region: Secondary | ICD-10-CM | POA: Diagnosis not present

## 2018-05-18 DIAGNOSIS — H6123 Impacted cerumen, bilateral: Secondary | ICD-10-CM | POA: Diagnosis not present

## 2018-05-18 DIAGNOSIS — M51369 Other intervertebral disc degeneration, lumbar region without mention of lumbar back pain or lower extremity pain: Secondary | ICD-10-CM

## 2018-05-18 DIAGNOSIS — J029 Acute pharyngitis, unspecified: Secondary | ICD-10-CM | POA: Insufficient documentation

## 2018-05-18 MED ORDER — PREDNISONE 50 MG PO TABS: ORAL_TABLET | ORAL | 0 refills | Status: DC

## 2018-05-18 NOTE — Progress Notes (Signed)
Subjective:    CC: Couple of complaints  HPI: Sore throat: Present for a few days, no cough, no fevers, chills.  Having some nasal fullness, sinus pressure, difficulty hearing.  No GI symptoms, no skin rash, no chest pain, shortness of breath.  Back pain: No lumbar degenerative disc disease, has done well in the past with chiropractic manipulation.  Now having persistent pain on top of this.  I reviewed the past medical history, family history, social history, surgical history, and allergies today and no changes were needed.  Please see the problem list section below in epic for further details.  Past Medical History: Past Medical History:  Diagnosis Date  . Hyperlipidemia 11/24/2012   Past Surgical History: Past Surgical History:  Procedure Laterality Date  . HEMORRHOID SURGERY     Social History: Social History   Socioeconomic History  . Marital status: Single    Spouse name: Not on file  . Number of children: Not on file  . Years of education: Not on file  . Highest education level: Not on file  Occupational History  . Not on file  Social Needs  . Financial resource strain: Not on file  . Food insecurity:    Worry: Not on file    Inability: Not on file  . Transportation needs:    Medical: Not on file    Non-medical: Not on file  Tobacco Use  . Smoking status: Former Smoker    Packs/day: 1.00    Years: 20.00    Pack years: 20.00    Types: Cigarettes    Start date: 09/02/1984    Last attempt to quit: 09/02/2004    Years since quitting: 13.7  . Smokeless tobacco: Never Used  Substance and Sexual Activity  . Alcohol use: No  . Drug use: No  . Sexual activity: Yes    Birth control/protection: Condom  Lifestyle  . Physical activity:    Days per week: Not on file    Minutes per session: Not on file  . Stress: Not on file  Relationships  . Social connections:    Talks on phone: Not on file    Gets together: Not on file    Attends religious service: Not on file      Active member of club or organization: Not on file    Attends meetings of clubs or organizations: Not on file    Relationship status: Not on file  Other Topics Concern  . Not on file  Social History Narrative  . Not on file   Family History: Family History  Problem Relation Age of Onset  . Heart disease Father   . Heart disease Paternal Grandfather    Allergies: No Known Allergies Medications: See med rec.  Review of Systems: No fevers, chills, night sweats, weight loss, chest pain, or shortness of breath.   Objective:    General: Well Developed, well nourished, and in no acute distress.  Neuro: Alert and oriented x3, extra-ocular muscles intact, sensation grossly intact.  HEENT: Normocephalic, atraumatic, pupils equal round reactive to light, neck supple, no masses, no lymphadenopathy, thyroid nonpalpable.  Mild appearing pharyngeal erythema without tonsillitis or exudates.  Bilateral cerumen impactions. Skin: Warm and dry, no rashes. Cardiac: Regular rate and rhythm, no murmurs rubs or gallops, no lower extremity edema.  Respiratory: Clear to auscultation bilaterally. Not using accessory muscles, speaking in full sentences. Back Exam:  Inspection: Unremarkable  Motion: Flexion 45 deg, Extension 45 deg, Side Bending to 45 deg bilaterally,  Rotation to 45 deg bilaterally  SLR laying: Negative  XSLR laying: Negative  Palpable tenderness: None. FABER: negative. Sensory change: Gross sensation intact to all lumbar and sacral dermatomes.  Reflexes: 2+ at both patellar tendons, 2+ at achilles tendons, Babinski's downgoing.  Strength at foot  Plantar-flexion: 5/5 Dorsi-flexion: 5/5 Eversion: 5/5 Inversion: 5/5  Leg strength  Quad: 5/5 Hamstring: 5/5 Hip flexor: 5/5 Hip abductors: 5/5  Gait unremarkable.  Indication: Cerumen impaction of the left and right ear(s) Medical necessity statement: On physical examination, cerumen impairs clinically significant portions of the  external auditory canal, and tympanic membrane. Noted obstructive, copious cerumen that cannot be removed without magnification and instrumentations requiring physician skills Consent: Discussed benefits and risks of procedure and verbal consent obtained Procedure: Patient was prepped for the procedure. Utilized an otoscope to assess and take note of the ear canal, the tympanic membrane, and the presence, amount, and placement of the cerumen. Gentle water irrigation was utilized to remove cerumen.  Post procedure examination: shows cerumen was completely removed. Patient tolerated procedure well. The patient is made aware that they may experience temporary vertigo, temporary hearing loss, and temporary discomfort. If these symptom last for more than 24 hours to call the clinic or proceed to the ED.  Impression and Recommendations:    Pharyngitis Mild viral appearing pharyngitis without exudates or tonsillitis. We will treat this conservatively. He does have bilateral cerumen impactions which were cleared with aggressive irrigation bilaterally.  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 Adding a burst of prednisone for now, rehab exercises, if persistent pain we will proceed with epidural. He has been doing chiropractic manipulation, Tylenol and over-the-counter Motrin. ___________________________________________ [name]. Benjamin Stain, M.D., ABFM., CAQSM. Primary Care and Sports Medicine [facility]  Adjunct Instructor of Family Medicine  [employer]

## 2018-05-18 NOTE — Assessment & Plan Note (Addendum)
Mild viral appearing pharyngitis without exudates or tonsillitis. We will treat this conservatively. He does have bilateral cerumen impactions which were cleared with aggressive irrigation bilaterally.

## 2018-05-18 NOTE — Assessment & Plan Note (Signed)
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 Adding a burst of prednisone for now, rehab exercises, if persistent pain we will proceed with epidural. He has been doing chiropractic manipulation, Tylenol and over-the-counter Motrin.

## 2018-06-04 ENCOUNTER — Ambulatory Visit (INDEPENDENT_AMBULATORY_CARE_PROVIDER_SITE_OTHER): Payer: BLUE CROSS/BLUE SHIELD | Admitting: Family Medicine

## 2018-06-04 ENCOUNTER — Encounter: Payer: Self-pay | Admitting: Family Medicine

## 2018-06-04 VITALS — BP 138/88 | HR 84 | Ht 72.0 in | Wt 265.0 lb

## 2018-06-04 DIAGNOSIS — S91209A Unspecified open wound of unspecified toe(s) with damage to nail, initial encounter: Secondary | ICD-10-CM | POA: Diagnosis not present

## 2018-06-04 DIAGNOSIS — L03031 Cellulitis of right toe: Secondary | ICD-10-CM

## 2018-06-04 MED ORDER — CLINDAMYCIN HCL 300 MG PO CAPS: 300.0000 mg | ORAL_CAPSULE | Freq: Three times a day (TID) | ORAL | 0 refills | Status: DC

## 2018-06-04 NOTE — Progress Notes (Signed)
Kristopher Snyder is a 47 y.o. male who presents to [facility]: Primary Care Sports Medicine today for right great toe pain.  Kristopher Snyder stubbed his right great toe and lifted his toenail about a week ago.  He notes this is painful and now has become red swollen and having some discharge.  He notes it is quite bothersome.  He denies any fevers or chills nausea vomiting or diarrhea.  He notes his right knee is a bit bothersome as well because he is been limping.  He has not had any specific treatment yet.  No fevers or chills.  He is very reluctant to consider removal of nail today.   ROS as above:  Exam:  BP 138/88   Pulse 84   Ht 6' (1.829 m)   Wt 265 lb (120.2 kg)   BMI 35.94 kg/m  Wt Readings from Last 5 Encounters:  06/04/18 265 lb (120.2 kg)  05/18/18 262 lb (118.8 kg)  02/16/18 239 lb (108.4 kg)  12/26/17 243 lb (110.2 kg)  10/24/17 256 lb (116.1 kg)    Gen: Well NAD HEENT: EOMI,  MMM Lungs: Normal work of breathing. CTABL Heart: RRR no MRG Abd: NABS, Soft. Nondistended, Nontender Exts: Brisk capillary refill, warm and well perfused.  Right great toe: Nail slightly elevated with partial avulsion approximately two thirds of the length of the toenail.  The skin surrounding the base of the nail is erythematous and tender with small amounts of discharge expressible. Plantar toe is nontender at the distal middle phalanx and MTP.  Lab and Radiology Results No results found for this or any previous visit (from the past 72 hour(s)). No results found.    Assessment and Plan: 47 y.o. male with  Right great toenail avulsion and now paronychia.  Lengthy discussion about options.  Patient declined x-ray.  Additionally he is reluctant to consider removal of toenail at this time.  Plan to treat with oral clindamycin antibiotics for paronychia.  Patient also declined postop shoe however he can get 1 of  his over-the-counter if he changes his mind.  Recheck if not improving.  Next step would be nail removal.   No orders of the defined types were placed in this encounter.  Meds ordered this encounter  Medications  . clindamycin (CLEOCIN) 300 MG capsule    Sig: Take 1 capsule (300 mg total) by mouth 3 (three) times daily.    Dispense:  30 capsule    Refill:  0     Historical information moved to improve visibility of documentation.  Past Medical History:  Diagnosis Date  . Hyperlipidemia 11/24/2012   Past Surgical History:  Procedure Laterality Date  . HEMORRHOID SURGERY     Social History   Tobacco Use  . Smoking status: Former Smoker    Packs/day: 1.00    Years: 20.00    Pack years: 20.00    Types: Cigarettes    Start date: 09/02/1984    Last attempt to quit: 09/02/2004    Years since quitting: 13.7  . Smokeless tobacco: Never Used  Substance Use Topics  . Alcohol use: No   family history includes Heart disease in his father and paternal grandfather.  Medications: Current Outpatient Medications  Medication Sig Dispense Refill  . alprazolam (XANAX) 2 MG tablet Take 2 mg by mouth 3 (three) times daily.    Marland Kitchen escitalopram (LEXAPRO) 10 MG tablet Take 1 tablet (10 mg total) by mouth daily. 30 tablet 3  .  IBU 800 MG tablet TAKE ONE TABLET THREE TIMES DAILY AS NEEDED 90 tablet 11  . predniSONE (DELTASONE) 50 MG tablet One tab PO daily for 5 days. 5 tablet 0  . clindamycin (CLEOCIN) 300 MG capsule Take 1 capsule (300 mg total) by mouth 3 (three) times daily. 30 capsule 0   No current facility-administered medications for this visit.    No Known Allergies   Discussed warning signs or symptoms. Please see discharge instructions. Patient expresses understanding.

## 2018-06-04 NOTE — Patient Instructions (Signed)
Thank you for coming in today. Take clindamycin three times daily for infection.  Keep the nail clean.  Use the post op shoe for comfort.    Nail Avulsion Nail avulsion is when a nail tears away from the nail bed due to an accident or an injury. Nail avulsion can be painful. Your finger or toe may bleed a lot, and you may have some pain, redness, throbbing, and swelling while it heals. Your nail will grow back within several months. Once it grows back, it might not look the same. This may happen even after taking good care of it. Follow these instructions at home: Wound care   Clean any dirt and debris from the wound.  If you notice bleeding, press gently on the nailbed with a gauze pad. Do this for 15 minutes.  If a health care provider closed your wound with stitches (sutures), leave them in place. They may need to stay in place for 2 weeks or longer. You may need to see your health care provider to have them removed.  Keep the wound dry for 48 hours. After 48 hours have passed, lightly wash the finger or toe in warm, soapy water 2-3 times a day. This helps to reduce pain and swelling and prevent infection. Dressing Care  Cover the wound with a clean gauze bandage (dressing). You may be able to stop wearing a dressing after 2?7 days.  Wash your hands with soap and water before you change your dressing. If soap and water are not available, use hand sanitizer.  Change the dressing once or twice a day. Always change the dressing: ? If the dressing gets wet or dirty. ? After washing your finger or toe. Medicine  Take over-the-counter pain medicine as needed. Do not take aspirin or products containing aspirin unless directed by your health care provider. These products can increase bleeding.  If you were prescribed an antibiotic medicine, take it or apply it as told by your health care provider. Do not stop taking or using the antibiotic even if you start to feel better. General  instructions  Keep the hand or foot with the nail injury raised above the level of your heart as much as possible. This helps to reduce pain and swelling.  Move the toe or finger often to avoid stiffness.  Do not smoke. Smoking can delay healing. If you need help quitting, talk to your health care provider. Contact a health care provider if:  You have swelling or pain that gets worse instead of better.  You have fluid, blood, or pus coming from your wound.  Your wound smells bad. Get help right away if:  You have bleeding that does not stop, even when you apply pressure to the wound.  You have a temperature that is higher than 104F (40C).  You cannot move your fingers or toes.  The affected finger or toe looks white or black. This information is not intended to replace advice given to you by your health care provider. Make sure you discuss any questions you have with your health care provider. Document Released: 09/26/2004 Document Revised: 04/18/2016 Document Reviewed: 01/04/2015 Elsevier Interactive Patient Education  [employer].

## 2018-06-23 ENCOUNTER — Ambulatory Visit: Payer: BLUE CROSS/BLUE SHIELD | Admitting: Sports Medicine

## 2018-06-23 ENCOUNTER — Encounter: Payer: Self-pay | Admitting: Sports Medicine

## 2018-06-23 DIAGNOSIS — M1711 Unilateral primary osteoarthritis, right knee: Secondary | ICD-10-CM | POA: Diagnosis not present

## 2018-06-23 NOTE — Assessment & Plan Note (Signed)
Aspiration and injection. Fluid is slightly cloudy, adding crystal analysis. Likely degenerative meniscal tear. Return in a month.

## 2018-06-23 NOTE — Progress Notes (Signed)
Subjective:    CC: Right knee pain  HPI: This is a pleasant 47 year old male, for the past several days he said worsening pain in his right knee, severe, persistent, localized to the medial joint line, no mechanical symptoms, locking, catching.  He has tried oral NSAIDs without any improvement.  I reviewed the past medical history, family history, social history, surgical history, and allergies today and no changes were needed.  Please see the problem list section below in epic for further details.  Past Medical History: Past Medical History:  Diagnosis Date  . Hyperlipidemia 11/24/2012   Past Surgical History: Past Surgical History:  Procedure Laterality Date  . HEMORRHOID SURGERY     Social History: Social History   Socioeconomic History  . Marital status: Single    Spouse name: Not on file  . Number of children: Not on file  . Years of education: Not on file  . Highest education level: Not on file  Occupational History  . Not on file  Social Needs  . Financial resource strain: Not on file  . Food insecurity:    Worry: Not on file    Inability: Not on file  . Transportation needs:    Medical: Not on file    Non-medical: Not on file  Tobacco Use  . Smoking status: Former Smoker    Packs/day: 1.00    Years: 20.00    Pack years: 20.00    Types: Cigarettes    Start date: 09/02/1984    Last attempt to quit: 09/02/2004    Years since quitting: 13.8  . Smokeless tobacco: Never Used  Substance and Sexual Activity  . Alcohol use: No  . Drug use: No  . Sexual activity: Yes    Birth control/protection: Condom  Lifestyle  . Physical activity:    Days per week: Not on file    Minutes per session: Not on file  . Stress: Not on file  Relationships  . Social connections:    Talks on phone: Not on file    Gets together: Not on file    Attends religious service: Not on file    Active member of club or organization: Not on file    Attends meetings of clubs or  organizations: Not on file    Relationship status: Not on file  Other Topics Concern  . Not on file  Social History Narrative  . Not on file   Family History: Family History  Problem Relation Age of Onset  . Heart disease Father   . Heart disease Paternal Grandfather    Allergies: No Known Allergies Medications: See med rec.  Review of Systems: No fevers, chills, night sweats, weight loss, chest pain, or shortness of breath.   Objective:    General: Well Developed, well nourished, and in no acute distress.  Neuro: Alert and oriented x3, extra-ocular muscles intact, sensation grossly intact.  HEENT: Normocephalic, atraumatic, pupils equal round reactive to light, neck supple, no masses, no lymphadenopathy, thyroid nonpalpable.  Skin: Warm and dry, no rashes. Cardiac: Regular rate and rhythm, no murmurs rubs or gallops, no lower extremity edema.  Respiratory: Clear to auscultation bilaterally. Not using accessory muscles, speaking in full sentences. Right knee: Swollen with a palpable fluid wave, effusion and tenderness at the medial joint line. ROM normal in flexion and extension and lower leg rotation. Ligaments with solid consistent endpoints including ACL, PCL, LCL, MCL. Negative Mcmurray's and provocative meniscal tests. Non painful patellar compression. Patellar and quadriceps tendons unremarkable. Hamstring  and quadriceps strength is normal.  Procedure: Real-time Ultrasound Guided aspiration/injection of right knee Device: GE Logiq E  Verbal informed consent obtained.  Time-out conducted.  Noted no overlying erythema, induration, or other signs of local infection.  Skin prepped in a sterile fashion.  Local anesthesia: Topical Ethyl chloride.  With sterile technique and under real time ultrasound guidance: Aspirated 15 mL of slightly cloudy straw-colored fluid, syringe switched and 1 cc kenalog 40, 2 cc lidocaine, 2 cc bupivacaine injected easily. Completed without  difficulty  Pain immediately resolved suggesting accurate placement of the medication.  Advised to call if fevers/chills, erythema, induration, drainage, or persistent bleeding.  Images permanently stored and available for review in the ultrasound unit.  Impression: Technically successful ultrasound guided injection.  Impression and Recommendations:    Primary osteoarthritis of right knee Aspiration and injection. Fluid is slightly cloudy, adding crystal analysis. Likely degenerative meniscal tear. Return in a month. ___________________________________________ [name]. Benjamin Stain, M.D., ABFM., CAQSM. Primary Care and Sports Medicine [place] [facility]  Adjunct Professor of Family Medicine  [employer]

## 2018-06-24 LAB — SYNOVIAL FLUID, CRYSTAL

## 2018-07-31 ENCOUNTER — Other Ambulatory Visit: Payer: Self-pay | Admitting: Sports Medicine

## 2018-08-05 DIAGNOSIS — M9902 Segmental and somatic dysfunction of thoracic region: Secondary | ICD-10-CM | POA: Diagnosis not present

## 2018-08-05 DIAGNOSIS — M545 Low back pain: Secondary | ICD-10-CM | POA: Diagnosis not present

## 2018-08-05 DIAGNOSIS — M9903 Segmental and somatic dysfunction of lumbar region: Secondary | ICD-10-CM | POA: Diagnosis not present

## 2018-08-05 DIAGNOSIS — M9904 Segmental and somatic dysfunction of sacral region: Secondary | ICD-10-CM | POA: Diagnosis not present

## 2018-08-10 DIAGNOSIS — M545 Low back pain: Secondary | ICD-10-CM | POA: Diagnosis not present

## 2018-08-10 DIAGNOSIS — M9904 Segmental and somatic dysfunction of sacral region: Secondary | ICD-10-CM | POA: Diagnosis not present

## 2018-08-10 DIAGNOSIS — M9902 Segmental and somatic dysfunction of thoracic region: Secondary | ICD-10-CM | POA: Diagnosis not present

## 2018-08-10 DIAGNOSIS — M9903 Segmental and somatic dysfunction of lumbar region: Secondary | ICD-10-CM | POA: Diagnosis not present

## 2018-08-27 ENCOUNTER — Ambulatory Visit: Payer: BLUE CROSS/BLUE SHIELD | Admitting: Sports Medicine

## 2018-08-31 ENCOUNTER — Ambulatory Visit (INDEPENDENT_AMBULATORY_CARE_PROVIDER_SITE_OTHER): Payer: BLUE CROSS/BLUE SHIELD | Admitting: Sports Medicine

## 2018-08-31 ENCOUNTER — Encounter: Payer: Self-pay | Admitting: Sports Medicine

## 2018-08-31 DIAGNOSIS — M1711 Unilateral primary osteoarthritis, right knee: Secondary | ICD-10-CM

## 2018-08-31 DIAGNOSIS — G8929 Other chronic pain: Secondary | ICD-10-CM | POA: Diagnosis not present

## 2018-08-31 DIAGNOSIS — M25561 Pain in right knee: Secondary | ICD-10-CM | POA: Diagnosis not present

## 2018-08-31 MED ORDER — TRAMADOL HCL 50 MG PO TABS: 50.0000 mg | ORAL_TABLET | Freq: Three times a day (TID) | ORAL | 0 refills | Status: DC | PRN

## 2018-08-31 NOTE — Assessment & Plan Note (Addendum)
Persistent pain and swelling, last injection was 2 months ago. Arthrocentesis today, MRI for further evaluation, suspect degenerative meniscal tear, I do expect he will need an arthroplasty. Adding tramadol for breakthrough pain

## 2018-08-31 NOTE — Progress Notes (Signed)
Subjective:    CC: Right knee swelling  HPI: Kristopher Snyder is a pleasant 47 year old male with known knee osteoarthritis.  We did an aspiration and injection approximately 2 months ago.  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  More recently he said increasing pain, swelling, moderate, persistent without radiation.  I reviewed the past medical history, family history, social history, surgical history, and allergies today and no changes were needed.  Please see the problem list section below in epic for further details.  Past Medical History: Past Medical History:  Diagnosis Date  . Hyperlipidemia 11/24/2012   Past Surgical History: Past Surgical History:  Procedure Laterality Date  . HEMORRHOID SURGERY     Social History: Social History   Socioeconomic History  . Marital status: Single    Spouse name: Not on file  . Number of children: Not on file  . Years of education: Not on file  . Highest education level: Not on file  Occupational History  . Not on file  Social Needs  . Financial resource strain: Not on file  . Food insecurity:    Worry: Not on file    Inability: Not on file  . Transportation needs:    Medical: Not on file    Non-medical: Not on file  Tobacco Use  . Smoking status: Former Smoker    Packs/day: 1.00    Years: 20.00    Pack years: 20.00    Types: Cigarettes    Start date: 09/02/1984    Last attempt to quit: 09/02/2004    Years since quitting: 14.0  . Smokeless tobacco: Never Used  Substance and Sexual Activity  . Alcohol use: No  . Drug use: No  . Sexual activity: Yes    Birth control/protection: Condom  Lifestyle  . Physical activity:    Days per week: Not on file    Minutes per session: Not on file  . Stress: Not on file  Relationships  . Social connections:    Talks on phone: Not on file    Gets together: Not on file    Attends religious service: Not on file    Active member of club or organization: Not on  file    Attends meetings of clubs or organizations: Not on file    Relationship status: Not on file  Other Topics Concern  . Not on file  Social History Narrative  . Not on file   Family History: Family History  Problem Relation Age of Onset  . Heart disease Father   . Heart disease Paternal Grandfather    Allergies: No Known Allergies Medications: See med rec.  Review of Systems: No fevers, chills, night sweats, weight loss, chest pain, or shortness of breath.   Objective:    General: Well Developed, well nourished, and in no acute distress.  Neuro: Alert and oriented x3, extra-ocular muscles intact, sensation grossly intact.  HEENT: Normocephalic, atraumatic, pupils equal round reactive to light, neck supple, no masses, no lymphadenopathy, thyroid nonpalpable.  Skin: Warm and dry, no rashes. Cardiac: Regular rate and rhythm, no murmurs rubs or gallops, no lower extremity edema.  Respiratory: Clear to auscultation bilaterally. Not using accessory muscles, speaking in full sentences. Right knee: Normal to inspection with no erythema or effusion or obvious bony abnormalities. Visibly swollen with a palpable fluid wave and effusion nderness. ROM normal in flexion and extension and lower leg rotation. Ligaments with solid consistent endpoints including ACL, PCL, LCL, MCL. Negative Mcmurray's and  provocative meniscal tests. Non painful patellar compression. Patellar and quadriceps tendons unremarkable. Hamstring and quadriceps strength is normal.  Procedure: Real-time Ultrasound Guided aspiration of right knee Device: GE Logiq E  Verbal informed consent obtained.  Time-out conducted.  Noted no overlying erythema, induration, or other signs of local infection.  Skin prepped in a sterile fashion.  Local anesthesia: Topical Ethyl chloride.  With sterile technique and under real time ultrasound guidance: Using an 18-gauge needle aspirated approximately 6 cc of clear,  straw-colored fluid. Completed without difficulty  Pain immediately resolved suggesting accurate placement of the medication.  Advised to call if fevers/chills, erythema, induration, drainage, or persistent bleeding.  Images permanently stored and available for review in the ultrasound unit.  Impression: Technically successful ultrasound guided injection.  Impression and Recommendations:    Primary osteoarthritis of right knee Persistent pain and swelling, last injection was 2 months ago. Arthrocentesis today, MRI for further evaluation, suspect degenerative meniscal tear, I do expect he will need an arthroplasty. Adding tramadol for breakthrough pain ___________________________________________ Ihor Austinhomas J. Benjamin Stainhekkekandam, M.D., ABFM., CAQSM. Primary Care and Sports Medicine [place] [facility]  Adjunct Professor of Family Medicine  [employer]

## 2018-09-01 ENCOUNTER — Ambulatory Visit: Payer: BLUE CROSS/BLUE SHIELD | Admitting: Sports Medicine

## 2018-09-07 ENCOUNTER — Ambulatory Visit (INDEPENDENT_AMBULATORY_CARE_PROVIDER_SITE_OTHER): Payer: BLUE CROSS/BLUE SHIELD

## 2018-09-07 DIAGNOSIS — M25561 Pain in right knee: Secondary | ICD-10-CM

## 2018-09-07 DIAGNOSIS — M1711 Unilateral primary osteoarthritis, right knee: Secondary | ICD-10-CM

## 2018-09-07 DIAGNOSIS — M25461 Effusion, right knee: Secondary | ICD-10-CM

## 2018-09-07 DIAGNOSIS — G8929 Other chronic pain: Secondary | ICD-10-CM

## 2018-09-07 DIAGNOSIS — M23321 Other meniscus derangements, posterior horn of medial meniscus, right knee: Secondary | ICD-10-CM | POA: Diagnosis not present

## 2018-09-08 ENCOUNTER — Telehealth: Payer: Self-pay | Admitting: *Deleted

## 2018-09-08 DIAGNOSIS — M1711 Unilateral primary osteoarthritis, right knee: Secondary | ICD-10-CM

## 2018-09-08 NOTE — Telephone Encounter (Signed)
Pt called back this afternoon about his knee MRI and would like to go ahead with a referral to a surgeon.

## 2018-09-08 NOTE — Telephone Encounter (Signed)
Done

## 2018-09-15 DIAGNOSIS — M25461 Effusion, right knee: Secondary | ICD-10-CM | POA: Diagnosis not present

## 2018-09-16 DIAGNOSIS — M94261 Chondromalacia, right knee: Secondary | ICD-10-CM | POA: Diagnosis not present

## 2018-09-16 DIAGNOSIS — M23221 Derangement of posterior horn of medial meniscus due to old tear or injury, right knee: Secondary | ICD-10-CM | POA: Diagnosis not present

## 2018-09-16 DIAGNOSIS — M23261 Derangement of other lateral meniscus due to old tear or injury, right knee: Secondary | ICD-10-CM | POA: Diagnosis not present

## 2018-09-16 DIAGNOSIS — G8918 Other acute postprocedural pain: Secondary | ICD-10-CM | POA: Diagnosis not present

## 2018-09-16 DIAGNOSIS — M6751 Plica syndrome, right knee: Secondary | ICD-10-CM | POA: Diagnosis not present

## 2018-09-24 DIAGNOSIS — M25461 Effusion, right knee: Secondary | ICD-10-CM | POA: Diagnosis not present

## 2018-10-08 DIAGNOSIS — M25461 Effusion, right knee: Secondary | ICD-10-CM | POA: Diagnosis not present

## 2018-10-08 DIAGNOSIS — Z9889 Other specified postprocedural states: Secondary | ICD-10-CM | POA: Diagnosis not present

## 2018-11-02 DIAGNOSIS — Z9889 Other specified postprocedural states: Secondary | ICD-10-CM | POA: Diagnosis not present

## 2018-11-02 DIAGNOSIS — M25561 Pain in right knee: Secondary | ICD-10-CM | POA: Diagnosis not present

## 2018-11-12 DIAGNOSIS — F411 Generalized anxiety disorder: Secondary | ICD-10-CM | POA: Diagnosis not present

## 2018-11-17 DIAGNOSIS — Z9889 Other specified postprocedural states: Secondary | ICD-10-CM | POA: Diagnosis not present

## 2018-11-17 DIAGNOSIS — M25461 Effusion, right knee: Secondary | ICD-10-CM | POA: Diagnosis not present

## 2019-02-25 DIAGNOSIS — M25461 Effusion, right knee: Secondary | ICD-10-CM | POA: Diagnosis not present

## 2019-02-25 DIAGNOSIS — M25561 Pain in right knee: Secondary | ICD-10-CM | POA: Diagnosis not present

## 2019-03-08 ENCOUNTER — Other Ambulatory Visit: Payer: Self-pay | Admitting: Sports Medicine

## 2019-05-11 DIAGNOSIS — F411 Generalized anxiety disorder: Secondary | ICD-10-CM | POA: Diagnosis not present

## 2019-05-12 IMAGING — MR MR KNEE*R* W/O CM
7 series · 40 of 40 positions shown · non-contrast
Comparison: Right knee x-rays dated July 10, 2017.

CLINICAL DATA: Chronic knee pain.

EXAM:
MRI OF THE RIGHT KNEE WITHOUT CONTRAST
TECHNIQUE: Multiplanar, multisequence MR imaging of the knee was performed. No
intravenous contrast was administered.

[Series 3: T2 fat-sat · axial · 4.0mm · 0.53mm/px · z∈[-80,+90]mm · 7 of 35 slices shown (1 of 3)]
[im 1/35]
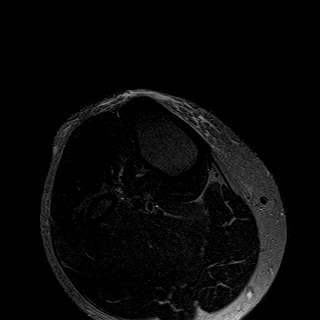
[im 6/35]
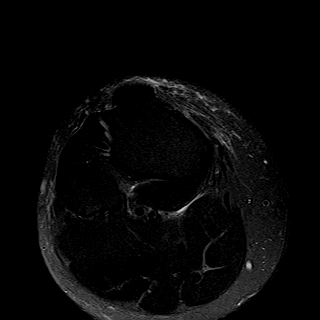
[im 12/35]
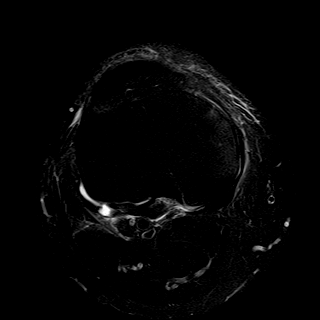
[im 18/35]
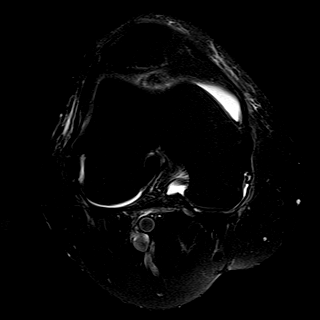
[im 23/35]
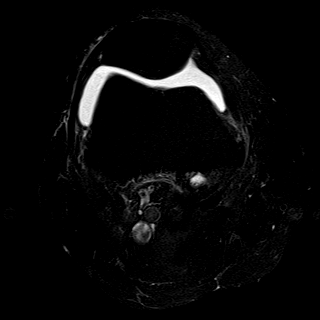
[im 29/35]
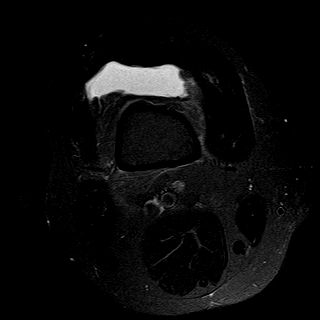
[im 35/35]
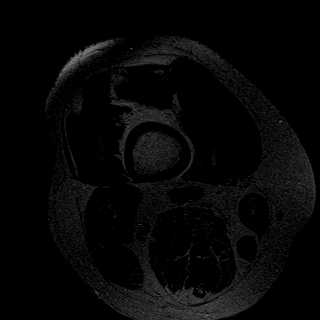

[Series 4: T1 · coronal · 4.0mm · 0.62mm/px · 6 of 35 slices shown]
[im 1/35]
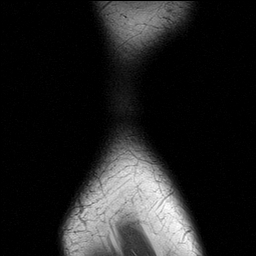
[im 7/35]
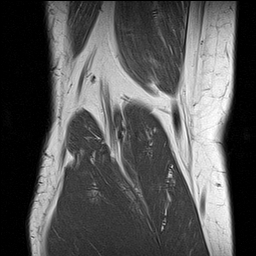
[im 14/35]
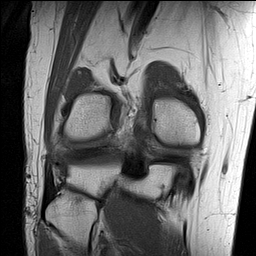
[im 21/35]
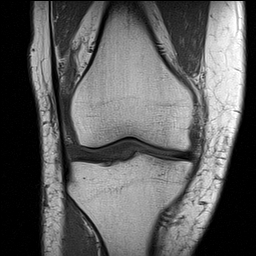
[im 28/35]
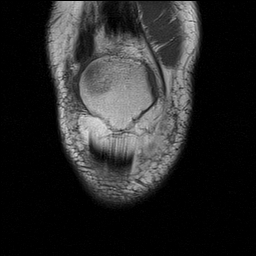
[im 35/35]
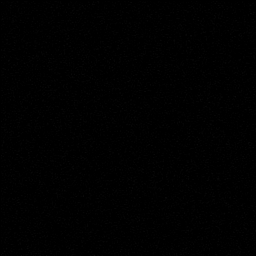

[Series 5: PD fat-sat · sagittal · 3.0mm · 0.62mm/px · 6 of 37 slices shown (1 of 3)]
[im 1/37]
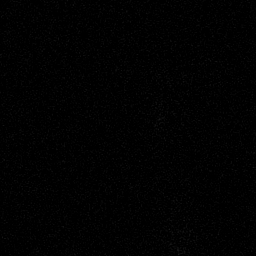
[im 8/37]
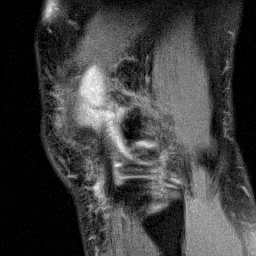
[im 15/37]
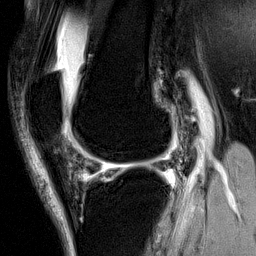
[im 22/37]
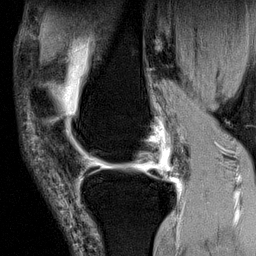
[im 29/37]
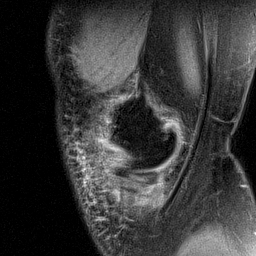
[im 37/37]
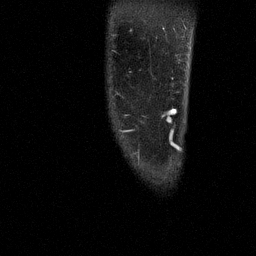

[Series 6: T2 fat-sat · coronal · 4.0mm · 0.62mm/px · 6 of 35 slices shown (2 of 3)]
[im 1/35]
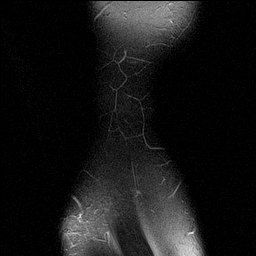
[im 7/35]
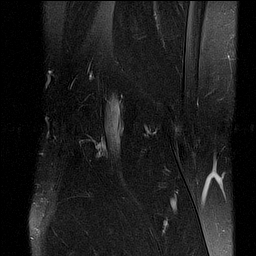
[im 14/35]
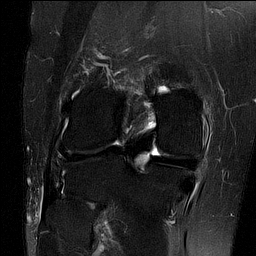
[im 21/35]
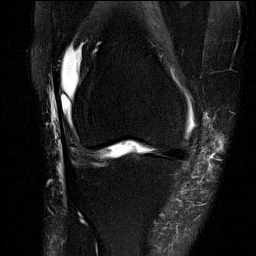
[im 28/35]
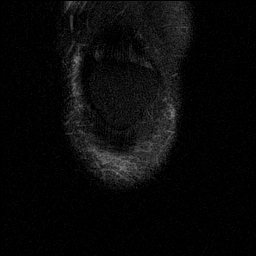
[im 35/35]
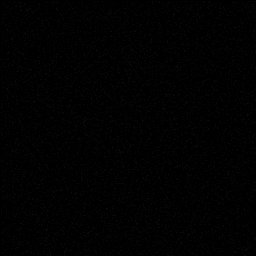

[Series 7: PD fat-sat · coronal · 4.0mm · 0.62mm/px · 6 of 35 slices shown (2 of 3)]
[im 1/35]
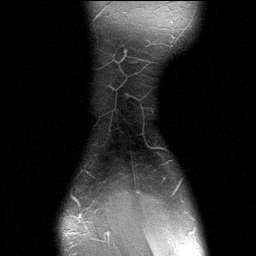
[im 7/35]
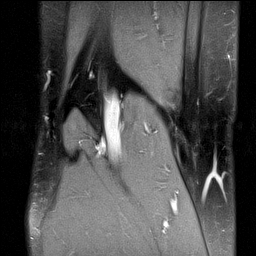
[im 14/35]
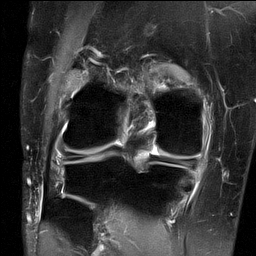
[im 21/35]
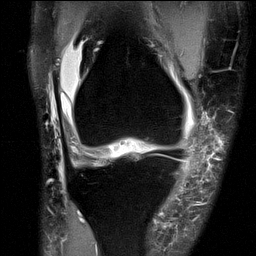
[im 28/35]
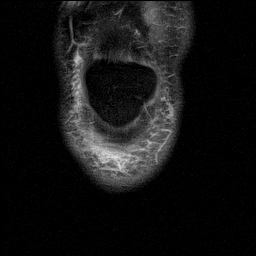
[im 35/35]
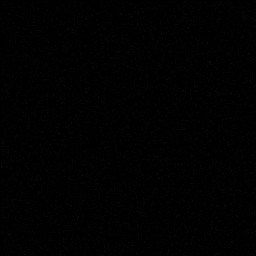

[Series 8: T2 fat-sat · sagittal · 3.0mm · 0.62mm/px · 6 of 37 slices shown (3 of 3)]
[im 1/37]
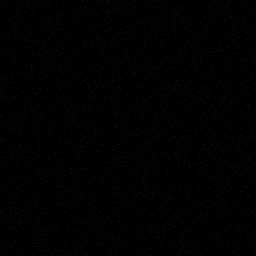
[im 8/37]
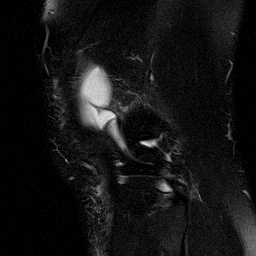
[im 15/37]
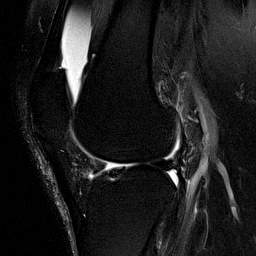
[im 22/37]
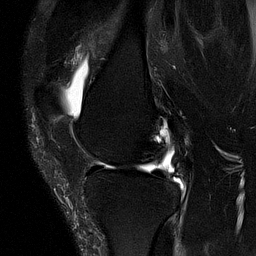
[im 29/37]
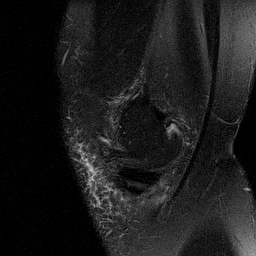
[im 37/37]
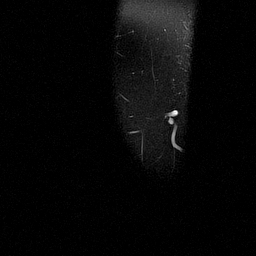

[Series 9: PD fat-sat · oblique · 2.0mm · 0.62mm/px · 3 of 19 slices shown (3 of 3)]
[im 1/19]
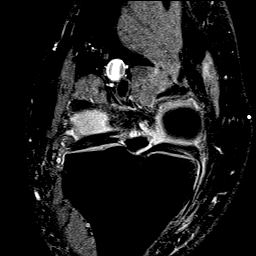
[im 10/19]
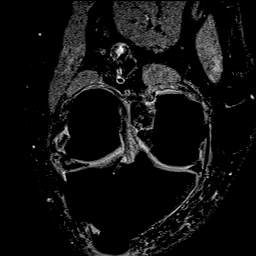
[im 19/19]
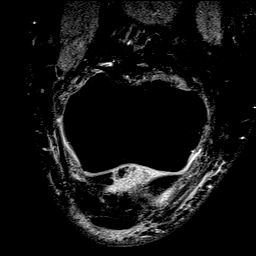

[40 of 40 positions shown; findings below may reference images not displayed]

FINDINGS: MENISCI

Medial meniscus: Focal complex undersurface tear at the
body/posterior horn junction.

Lateral meniscus:  Horizontal tear of the body.

LIGAMENTS

Cruciates:  Intact ACL and PCL.

Collaterals: Medial collateral ligament is intact. Lateral
collateral ligament complex is intact.

CARTILAGE

Patellofemoral: Mild diffuse cartilage thinning without focal
defect.

Medial:  Mild diffuse cartilage thinning without focal defect.

Lateral:  Normal.

Joint: Moderate to large joint effusion. No plical thickening. Mild
edema within Hoffa's fat.

Popliteal Fossa:  No Baker cyst. Intact popliteus tendon.

Extensor Mechanism: Intact quadriceps tendon and patellar tendon.
Mild patellar tendinosis. Intact medial and lateral patellar
retinaculum. Intact MPFL.

Bones: Degenerative marrow edema in the peripheral medial tibial
plateau. No acute fracture or dislocation. No focal bone lesion.

Other: None.
IMPRESSION: 1. Focal complex undersurface tear at the medial meniscus
body/posterior horn junction.
2. Horizontal tear of the lateral meniscus body.
3. Mild medial and patellofemoral compartment osteoarthritis.
4. Moderate to large joint effusion.

## 2019-08-17 ENCOUNTER — Other Ambulatory Visit: Payer: Self-pay | Admitting: Sports Medicine

## 2019-08-17 DIAGNOSIS — E785 Hyperlipidemia, unspecified: Secondary | ICD-10-CM

## 2019-08-17 DIAGNOSIS — Z0000 Encounter for general adult medical examination without abnormal findings: Secondary | ICD-10-CM | POA: Diagnosis not present

## 2019-08-17 NOTE — Progress Notes (Signed)
Labs for physical

## 2019-08-18 ENCOUNTER — Encounter: Payer: Self-pay | Admitting: Sports Medicine

## 2019-08-18 ENCOUNTER — Ambulatory Visit (INDEPENDENT_AMBULATORY_CARE_PROVIDER_SITE_OTHER): Payer: BLUE CROSS/BLUE SHIELD | Admitting: Sports Medicine

## 2019-08-18 ENCOUNTER — Other Ambulatory Visit: Payer: Self-pay

## 2019-08-18 VITALS — BP 152/91 | HR 91 | Ht 72.0 in | Wt 278.0 lb

## 2019-08-18 DIAGNOSIS — F321 Major depressive disorder, single episode, moderate: Secondary | ICD-10-CM

## 2019-08-18 DIAGNOSIS — E785 Hyperlipidemia, unspecified: Secondary | ICD-10-CM | POA: Diagnosis not present

## 2019-08-18 DIAGNOSIS — L918 Other hypertrophic disorders of the skin: Secondary | ICD-10-CM | POA: Diagnosis not present

## 2019-08-18 DIAGNOSIS — Z0000 Encounter for general adult medical examination without abnormal findings: Secondary | ICD-10-CM

## 2019-08-18 DIAGNOSIS — G4733 Obstructive sleep apnea (adult) (pediatric): Secondary | ICD-10-CM

## 2019-08-18 LAB — LIPID PANEL W/REFLEX DIRECT LDL
Cholesterol: 232 mg/dL — ABNORMAL HIGH (ref <200)
HDL: 41 mg/dL (ref >=40)
LDL Cholesterol (Calc): 150 mg/dL (calc) — ABNORMAL HIGH
Non-HDL Cholesterol (Calc): 191 mg/dL (calc) — ABNORMAL HIGH (ref <130)
Total CHOL/HDL Ratio: 5.7 (calc) — ABNORMAL HIGH (ref <5.0)
Triglycerides: 242 mg/dL — ABNORMAL HIGH (ref <150)

## 2019-08-18 LAB — CBC WITH DIFFERENTIAL/PLATELET
Absolute Monocytes: 515 cells/uL (ref 200–950)
Basophils Absolute: 40 cells/uL (ref 0–200)
Basophils Relative: 0.8 %
Eosinophils Absolute: 70 cells/uL (ref 15–500)
Eosinophils Relative: 1.4 %
HCT: 48.3 % (ref 38.5–50.0)
Hemoglobin: 17 g/dL (ref 13.2–17.1)
Lymphs Abs: 1175 cells/uL (ref 850–3900)
MCH: 34 pg — ABNORMAL HIGH (ref 27.0–33.0)
MCHC: 35.2 g/dL (ref 32.0–36.0)
MCV: 96.6 fL (ref 80.0–100.0)
MPV: 10.1 fL (ref 7.5–12.5)
Monocytes Relative: 10.3 %
Neutro Abs: 3200 cells/uL (ref 1500–7800)
Neutrophils Relative %: 64 %
Platelets: 152 10*3/uL (ref 140–400)
RBC: 5 10*6/uL (ref 4.20–5.80)
RDW: 12.8 % (ref 11.0–15.0)
Total Lymphocyte: 23.5 %
WBC: 5 10*3/uL (ref 3.8–10.8)

## 2019-08-18 LAB — COMPLETE METABOLIC PANEL WITH GFR
AG Ratio: 1.2 (calc) (ref 1.0–2.5)
ALT: 84 U/L — ABNORMAL HIGH (ref 9–46)
AST: 50 U/L — ABNORMAL HIGH (ref 10–40)
Albumin: 4.2 g/dL (ref 3.6–5.1)
Alkaline phosphatase (APISO): 80 U/L (ref 36–130)
BUN: 17 mg/dL (ref 7–25)
CO2: 26 mmol/L (ref 20–32)
Calcium: 9.3 mg/dL (ref 8.6–10.3)
Chloride: 105 mmol/L (ref 98–110)
Creat: 0.86 mg/dL (ref 0.60–1.35)
GFR, Est African American: 119 mL/min/{1.73_m2} (ref >=60)
GFR, Est Non African American: 103 mL/min/{1.73_m2} (ref >=60)
Globulin: 3.4 g/dL (calc) (ref 1.9–3.7)
Glucose, Bld: 116 mg/dL — ABNORMAL HIGH (ref 65–99)
Potassium: 4 mmol/L (ref 3.5–5.3)
Sodium: 139 mmol/L (ref 135–146)
Total Bilirubin: 0.6 mg/dL (ref 0.2–1.2)
Total Protein: 7.6 g/dL (ref 6.1–8.1)

## 2019-08-18 MED ORDER — ESCITALOPRAM OXALATE 5 MG PO TABS: 5.0000 mg | ORAL_TABLET | Freq: Every day | ORAL | 3 refills | Status: DC

## 2019-08-18 MED ORDER — ATORVASTATIN CALCIUM 10 MG PO TABS: 10.0000 mg | ORAL_TABLET | Freq: Every day | ORAL | 3 refills | Status: DC

## 2019-08-18 NOTE — Assessment & Plan Note (Signed)
Starting 10 mg of atorvastatin, recheck lipids in 3 months.

## 2019-08-18 NOTE — Assessment & Plan Note (Signed)
Repeating sleep study. Excessive daytime sleepiness, snoring, known apnea/hypopnea in the past.

## 2019-08-18 NOTE — Progress Notes (Signed)
Subjective:    CC: Annual physical exam  HPI:  Kristopher Snyder returns, he has had a very difficult 2020, he has lost his aunt, brother, father.  Brother committed suicide, aunt died of an unknown cancer, his father passed from Alzheimer's disease but did have a very strong history of CAD.    Mood disorder: For the above reason Kristopher Snyder is feeling quite depressed, he is not sleeping well, no suicidal or homicidal ideation.  Obstructive sleep apnea: He has a diagnosis from a decade ago, untreated.  Significant excessive daytime sleepiness.  Hyperlipidemia: Recently noted on his blood work.  Agreeable to start a statin.  Skin tags: Would like cryotherapy.  I reviewed the past medical history, family history, social history, surgical history, and allergies today and no changes were needed.  Please see the problem list section below in epic for further details.  Past Medical History: Past Medical History:  Diagnosis Date  . Hyperlipidemia 11/24/2012   Past Surgical History: Past Surgical History:  Procedure Laterality Date  . HEMORRHOID SURGERY     Social History: Social History   Socioeconomic History  . Marital status: Single    Spouse name: Not on file  . Number of children: Not on file  . Years of education: Not on file  . Highest education level: Not on file  Occupational History  . Not on file  Tobacco Use  . Smoking status: Former Smoker    Packs/day: 1.00    Years: 20.00    Pack years: 20.00    Types: Cigarettes    Start date: 09/02/1984    Quit date: 09/02/2004    Years since quitting: 14.9  . Smokeless tobacco: Never Used  Substance and Sexual Activity  . Alcohol use: No  . Drug use: No  . Sexual activity: Yes    Birth control/protection: Condom  Other Topics Concern  . Not on file  Social History Narrative  . Not on file   Social Determinants of Health   Financial Resource Strain:   . Difficulty of Paying Living Expenses: Not on file  Food Insecurity:   . Worried  About Charity fundraiser in the Last Year: Not on file  . Ran Out of Food in the Last Year: Not on file  Transportation Needs:   . Lack of Transportation (Medical): Not on file  . Lack of Transportation (Non-Medical): Not on file  Physical Activity:   . Days of Exercise per Week: Not on file  . Minutes of Exercise per Session: Not on file  Stress:   . Feeling of Stress : Not on file  Social Connections:   . Frequency of Communication with Friends and Family: Not on file  . Frequency of Social Gatherings with Friends and Family: Not on file  . Attends Religious Services: Not on file  . Active Member of Clubs or Organizations: Not on file  . Attends Archivist Meetings: Not on file  . Marital Status: Not on file   Family History: Family History  Problem Relation Age of Onset  . Heart disease Father   . Heart disease Paternal Grandfather    Allergies: No Known Allergies Medications: See med rec.  Review of Systems: No headache, visual changes, nausea, vomiting, diarrhea, constipation, dizziness, abdominal pain, skin rash, fevers, chills, night sweats, swollen lymph nodes, weight loss, chest pain, body aches, joint swelling, muscle aches, shortness of breath, mood changes, visual or auditory hallucinations.  Objective:    General: Well Developed, well nourished,  and in no acute distress.  Neuro: Alert and oriented x3, extra-ocular muscles intact, sensation grossly intact. Cranial nerves II through XII are intact, motor, sensory, and coordinative functions are all intact. HEENT: Normocephalic, atraumatic, pupils equal round reactive to light, neck supple, no masses, no lymphadenopathy, thyroid nonpalpable. Oropharynx, nasopharynx, external ear canals are unremarkable. Skin: Warm and dry, no rashes noted.  Cardiac: Regular rate and rhythm, no murmurs rubs or gallops.  Respiratory: Clear to auscultation bilaterally. Not using accessory muscles, speaking in full sentences.    Abdominal: Soft, nontender, nondistended, positive bowel sounds, no masses, no organomegaly.  Musculoskeletal: Shoulder, elbow, wrist, hip, knee, ankle stable, and with full range of motion.  Procedure:  Cryodestruction of skin tags in the left and right axilla Consent obtained and verified. Time-out conducted. Noted no overlying erythema, induration, or other signs of local infection. Completed without difficulty using Cryo-Gun. Advised to call if fevers/chills, erythema, induration, drainage, or persistent bleeding.  Impression and Recommendations:    The patient was counselled, risk factors were discussed, anticipatory guidance given.  Annual physical exam Annual physical as above, Return in 1 year for this.  Hyperlipidemia Starting 10 mg of atorvastatin, recheck lipids in 3 months.  Depression, major, single episode, moderate (HCC) Lexapro 5, return in a month for repeat PHQ and GAD  Obstructive sleep apnea Repeating sleep study. Excessive daytime sleepiness, snoring, known apnea/hypopnea in the past.  Acrochordon Cryotherapy of the left and right axillary acrochordon.   ___________________________________________ [name]. Benjamin Stain, M.D., ABFM., CAQSM. Primary Care and Sports Medicine [place] [facility]  Adjunct Professor of Family Medicine  [employer]

## 2019-08-18 NOTE — Assessment & Plan Note (Signed)
Lexapro 5, return in a month for repeat PHQ and GAD

## 2019-08-18 NOTE — Assessment & Plan Note (Signed)
Cryotherapy of the left and right axillary acrochordon.

## 2019-08-18 NOTE — Assessment & Plan Note (Signed)
Annual physical as above, Return in 1 year for this.

## 2019-08-25 DIAGNOSIS — G4730 Sleep apnea, unspecified: Secondary | ICD-10-CM | POA: Diagnosis not present

## 2019-09-08 DIAGNOSIS — M9904 Segmental and somatic dysfunction of sacral region: Secondary | ICD-10-CM | POA: Diagnosis not present

## 2019-09-08 DIAGNOSIS — M545 Low back pain: Secondary | ICD-10-CM | POA: Diagnosis not present

## 2019-09-08 DIAGNOSIS — M9902 Segmental and somatic dysfunction of thoracic region: Secondary | ICD-10-CM | POA: Diagnosis not present

## 2019-09-08 DIAGNOSIS — M9903 Segmental and somatic dysfunction of lumbar region: Secondary | ICD-10-CM | POA: Diagnosis not present

## 2019-09-13 DIAGNOSIS — M9903 Segmental and somatic dysfunction of lumbar region: Secondary | ICD-10-CM | POA: Diagnosis not present

## 2019-09-13 DIAGNOSIS — M545 Low back pain: Secondary | ICD-10-CM | POA: Diagnosis not present

## 2019-09-13 DIAGNOSIS — M9902 Segmental and somatic dysfunction of thoracic region: Secondary | ICD-10-CM | POA: Diagnosis not present

## 2019-09-13 DIAGNOSIS — M9904 Segmental and somatic dysfunction of sacral region: Secondary | ICD-10-CM | POA: Diagnosis not present

## 2019-09-15 ENCOUNTER — Ambulatory Visit (INDEPENDENT_AMBULATORY_CARE_PROVIDER_SITE_OTHER): Payer: BC Managed Care – PPO | Admitting: Sports Medicine

## 2019-09-15 ENCOUNTER — Other Ambulatory Visit: Payer: Self-pay

## 2019-09-15 ENCOUNTER — Encounter: Payer: Self-pay | Admitting: Sports Medicine

## 2019-09-15 DIAGNOSIS — B079 Viral wart, unspecified: Secondary | ICD-10-CM | POA: Insufficient documentation

## 2019-09-15 DIAGNOSIS — B078 Other viral warts: Secondary | ICD-10-CM

## 2019-09-15 DIAGNOSIS — F321 Major depressive disorder, single episode, moderate: Secondary | ICD-10-CM | POA: Diagnosis not present

## 2019-09-15 DIAGNOSIS — L918 Other hypertrophic disorders of the skin: Secondary | ICD-10-CM

## 2019-09-15 DIAGNOSIS — M1711 Unilateral primary osteoarthritis, right knee: Secondary | ICD-10-CM

## 2019-09-15 MED ORDER — ESCITALOPRAM OXALATE 10 MG PO TABS: 10.0000 mg | ORAL_TABLET | Freq: Every day | ORAL | 3 refills | Status: DC

## 2019-09-15 NOTE — Assessment & Plan Note (Addendum)
Kristopher Snyder has noted a wart on his thumb, I performed aggressive cryotherapy today. We can recheck this at the 1 month follow-up.

## 2019-09-15 NOTE — Assessment & Plan Note (Signed)
I did cryotherapy of a couple of acrochordons in the axillae at the last visit, these are now completely resolved and flat.

## 2019-09-15 NOTE — Assessment & Plan Note (Signed)
Has started losing a significant amount of weight, knees are starting to feel significantly better. We did an arthrocentesis last in December 2019. He does have some tramadol to use for breakthrough pain.

## 2019-09-15 NOTE — Progress Notes (Signed)
    Procedures performed today:    Procedure:  Cryodestruction of right thumb verruca Consent obtained and verified. Time-out conducted. Noted no overlying erythema, induration, or other signs of local infection. Completed without difficulty using Cryo-Gun. Advised to call if fevers/chills, erythema, induration, drainage, or persistent bleeding.  Independent interpretation of tests performed by another provider:   None.  Impression and Recommendations:    Acrochordon I did cryotherapy of a couple of acrochordons in the axillae at the last visit, these are now completely resolved and flat.  Depression, major, single episode, moderate (HCC) Still has significant anxiety and depressive symptoms. Increasing Lexapro to 10 mg daily, return in a month, no suicidal or homicidal ideation.  Primary osteoarthritis of right knee Has started losing a significant amount of weight, knees are starting to feel significantly better. We did an arthrocentesis last in December 2019. He does have some tramadol to use for breakthrough pain.  Verruca Yaakov has noted a wart on his thumb, I performed aggressive cryotherapy today. We can recheck this at the 1 month follow-up.    ___________________________________________ Kristopher Snyder. Benjamin Stain, M.D., ABFM., CAQSM. Primary Care and Sports Medicine [place] [facility]  Adjunct Instructor of Family Medicine  [employer]

## 2019-09-15 NOTE — Assessment & Plan Note (Signed)
Still has significant anxiety and depressive symptoms. Increasing Lexapro to 10 mg daily, return in a month, no suicidal or homicidal ideation.

## 2019-09-22 DIAGNOSIS — G4733 Obstructive sleep apnea (adult) (pediatric): Secondary | ICD-10-CM | POA: Diagnosis not present

## 2019-09-27 ENCOUNTER — Ambulatory Visit: Payer: BC Managed Care – PPO | Admitting: Sports Medicine

## 2019-09-27 DIAGNOSIS — M9904 Segmental and somatic dysfunction of sacral region: Secondary | ICD-10-CM | POA: Diagnosis not present

## 2019-09-27 DIAGNOSIS — M9903 Segmental and somatic dysfunction of lumbar region: Secondary | ICD-10-CM | POA: Diagnosis not present

## 2019-09-27 DIAGNOSIS — M545 Low back pain: Secondary | ICD-10-CM | POA: Diagnosis not present

## 2019-09-27 DIAGNOSIS — M9902 Segmental and somatic dysfunction of thoracic region: Secondary | ICD-10-CM | POA: Diagnosis not present

## 2019-09-28 ENCOUNTER — Other Ambulatory Visit: Payer: Self-pay

## 2019-09-28 ENCOUNTER — Encounter: Payer: Self-pay | Admitting: Sports Medicine

## 2019-09-28 ENCOUNTER — Ambulatory Visit (INDEPENDENT_AMBULATORY_CARE_PROVIDER_SITE_OTHER): Payer: BC Managed Care – PPO | Admitting: Sports Medicine

## 2019-09-28 DIAGNOSIS — R42 Dizziness and giddiness: Secondary | ICD-10-CM

## 2019-09-28 MED ORDER — DIAZEPAM 5 MG PO TABS: 5.0000 mg | ORAL_TABLET | Freq: Three times a day (TID) | ORAL | 0 refills | Status: DC | PRN

## 2019-09-28 MED ORDER — PREDNISONE 50 MG PO TABS: 50.0000 mg | ORAL_TABLET | Freq: Every day | ORAL | 0 refills | Status: DC

## 2019-09-28 NOTE — Assessment & Plan Note (Signed)
Kristopher Snyder is having another episode of vertigo, present for the past several days, worse with changes in head position. He also has some increased tinnitus, as well as pressure in his ears. 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 Because of the ringing and pressure I would like ear nose and throat to weigh in for concerns of Mnire's disease. He can return to see me in 1 month for this.

## 2019-09-28 NOTE — Progress Notes (Signed)
    Procedures performed today:    None.  Independent interpretation of tests performed by another provider:   None.  Impression and Recommendations:    Vertigo Daimion is having another episode of vertigo, present for the past several days, worse with changes in head position. He also has some increased tinnitus, as well as pressure in his ears. 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 Because of the ringing and pressure I would like ear nose and throat to weigh in for concerns of Mnire's disease. He can return to see me in 1 month for this.    ___________________________________________ [name]. Benjamin Stain, M.D., ABFM., CAQSM. Primary Care and Sports Medicine [place] [facility]  Adjunct Instructor of Family Medicine  [employer]

## 2019-09-30 ENCOUNTER — Other Ambulatory Visit: Payer: Self-pay | Admitting: Sports Medicine

## 2019-09-30 DIAGNOSIS — G4733 Obstructive sleep apnea (adult) (pediatric): Secondary | ICD-10-CM

## 2019-09-30 NOTE — Progress Notes (Signed)
[id number]

## 2019-09-30 NOTE — Assessment & Plan Note (Signed)
Sleep study is positive, adding an auto titrating CPAP

## 2019-10-01 ENCOUNTER — Ambulatory Visit: Payer: BC Managed Care – PPO | Admitting: Rehabilitative and Restorative Service Providers"

## 2019-10-15 ENCOUNTER — Ambulatory Visit: Payer: BC Managed Care – PPO | Admitting: Sports Medicine

## 2019-10-23 DIAGNOSIS — G4733 Obstructive sleep apnea (adult) (pediatric): Secondary | ICD-10-CM | POA: Diagnosis not present

## 2019-11-12 ENCOUNTER — Other Ambulatory Visit: Payer: Self-pay | Admitting: Sports Medicine

## 2019-12-07 DIAGNOSIS — G4733 Obstructive sleep apnea (adult) (pediatric): Secondary | ICD-10-CM | POA: Diagnosis not present

## 2019-12-21 DIAGNOSIS — G4733 Obstructive sleep apnea (adult) (pediatric): Secondary | ICD-10-CM | POA: Diagnosis not present

## 2019-12-22 DIAGNOSIS — Z634 Disappearance and death of family member: Secondary | ICD-10-CM | POA: Diagnosis not present

## 2019-12-22 DIAGNOSIS — G4733 Obstructive sleep apnea (adult) (pediatric): Secondary | ICD-10-CM | POA: Diagnosis not present

## 2019-12-22 DIAGNOSIS — F411 Generalized anxiety disorder: Secondary | ICD-10-CM | POA: Diagnosis not present

## 2019-12-22 DIAGNOSIS — Z79899 Other long term (current) drug therapy: Secondary | ICD-10-CM | POA: Diagnosis not present

## 2020-01-19 ENCOUNTER — Other Ambulatory Visit: Payer: Self-pay | Admitting: Sports Medicine

## 2020-01-19 DIAGNOSIS — F321 Major depressive disorder, single episode, moderate: Secondary | ICD-10-CM

## 2020-01-19 MED ORDER — ESCITALOPRAM OXALATE 10 MG PO TABS: 10.0000 mg | ORAL_TABLET | Freq: Every day | ORAL | 3 refills | Status: DC

## 2020-01-20 DIAGNOSIS — G4733 Obstructive sleep apnea (adult) (pediatric): Secondary | ICD-10-CM | POA: Diagnosis not present

## 2020-01-28 DIAGNOSIS — G4733 Obstructive sleep apnea (adult) (pediatric): Secondary | ICD-10-CM | POA: Diagnosis not present

## 2020-02-20 DIAGNOSIS — G4733 Obstructive sleep apnea (adult) (pediatric): Secondary | ICD-10-CM | POA: Diagnosis not present

## 2020-03-21 DIAGNOSIS — G4733 Obstructive sleep apnea (adult) (pediatric): Secondary | ICD-10-CM | POA: Diagnosis not present

## 2020-04-21 DIAGNOSIS — G4733 Obstructive sleep apnea (adult) (pediatric): Secondary | ICD-10-CM | POA: Diagnosis not present

## 2020-04-28 DIAGNOSIS — G4733 Obstructive sleep apnea (adult) (pediatric): Secondary | ICD-10-CM | POA: Diagnosis not present

## 2020-05-22 DIAGNOSIS — G4733 Obstructive sleep apnea (adult) (pediatric): Secondary | ICD-10-CM | POA: Diagnosis not present

## 2020-05-29 DIAGNOSIS — M9902 Segmental and somatic dysfunction of thoracic region: Secondary | ICD-10-CM | POA: Diagnosis not present

## 2020-05-29 DIAGNOSIS — M9904 Segmental and somatic dysfunction of sacral region: Secondary | ICD-10-CM | POA: Diagnosis not present

## 2020-05-29 DIAGNOSIS — M9903 Segmental and somatic dysfunction of lumbar region: Secondary | ICD-10-CM | POA: Diagnosis not present

## 2020-05-29 DIAGNOSIS — M9901 Segmental and somatic dysfunction of cervical region: Secondary | ICD-10-CM | POA: Diagnosis not present

## 2020-05-31 DIAGNOSIS — M9904 Segmental and somatic dysfunction of sacral region: Secondary | ICD-10-CM | POA: Diagnosis not present

## 2020-05-31 DIAGNOSIS — M9902 Segmental and somatic dysfunction of thoracic region: Secondary | ICD-10-CM | POA: Diagnosis not present

## 2020-05-31 DIAGNOSIS — M9901 Segmental and somatic dysfunction of cervical region: Secondary | ICD-10-CM | POA: Diagnosis not present

## 2020-05-31 DIAGNOSIS — M9903 Segmental and somatic dysfunction of lumbar region: Secondary | ICD-10-CM | POA: Diagnosis not present

## 2020-06-02 ENCOUNTER — Other Ambulatory Visit: Payer: Self-pay

## 2020-06-02 ENCOUNTER — Ambulatory Visit (INDEPENDENT_AMBULATORY_CARE_PROVIDER_SITE_OTHER): Payer: BLUE CROSS/BLUE SHIELD | Admitting: Sports Medicine

## 2020-06-02 ENCOUNTER — Encounter: Payer: Self-pay | Admitting: Sports Medicine

## 2020-06-02 DIAGNOSIS — F411 Generalized anxiety disorder: Secondary | ICD-10-CM

## 2020-06-02 DIAGNOSIS — E669 Obesity, unspecified: Secondary | ICD-10-CM | POA: Diagnosis not present

## 2020-06-02 MED ORDER — PHENTERMINE HCL 37.5 MG PO TABS: ORAL_TABLET | ORAL | 0 refills | Status: DC

## 2020-06-02 MED ORDER — ZOLPIDEM TARTRATE 10 MG PO TABS: 10.0000 mg | ORAL_TABLET | Freq: Every evening | ORAL | 0 refills | Status: DC | PRN

## 2020-06-02 NOTE — Progress Notes (Signed)
    Procedures performed today:    None.  Independent interpretation of notes and tests performed by another provider:   None.  Brief History, Exam, Impression, and Recommendations:    Generalized anxiety disorder Kristopher Snyder is having a tough time, he is around the anniversary of his father's passing, and he tends to lay awake at night with his CPAP, he is not getting good sleep and thus is feeling tired through the day. In addition he has some issues with his car, a glazed clutch after only a few 100 miles, and needed to buy another 1 with his own money. 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 I am going to give him 2 weeks of Ambien at night. He understands he needs to give himself at least 8 hours to sleep. No change in Lexapro dosing.  Obesity (BMI 30-39.9) We are also going to give Kristopher Snyder a jumpstart on his weight loss, adding phentermine, we will do approximately 3 to 6 months of this. Return monthly for weight checks. He did decline Wegovy or any other injectable.    ___________________________________________ [name]. Benjamin Stain, M.D., ABFM., CAQSM. Primary Care and Sports Medicine [place] [facility]  Adjunct Instructor of Family Medicine  [employer]

## 2020-06-02 NOTE — Assessment & Plan Note (Signed)
We are also going to give Kristopher Snyder a jumpstart on his weight loss, adding phentermine, we will do approximately 3 to 6 months of this. Return monthly for weight checks. He did decline Wegovy or any other injectable.

## 2020-06-02 NOTE — Assessment & Plan Note (Signed)
Kristopher Snyder is having a tough time, he is around the anniversary of his father's passing, and he tends to lay awake at night with his CPAP, he is not getting good sleep and thus is feeling tired through the day. In addition he has some issues with his car, a glazed clutch after only a few 100 miles, and needed to buy another 1 with his own money. 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 I am going to give him 2 weeks of Ambien at night. He understands he needs to give himself at least 8 hours to sleep. No change in Lexapro dosing.

## 2020-06-14 DIAGNOSIS — Z79899 Other long term (current) drug therapy: Secondary | ICD-10-CM | POA: Diagnosis not present

## 2020-06-14 DIAGNOSIS — F411 Generalized anxiety disorder: Secondary | ICD-10-CM | POA: Diagnosis not present

## 2020-06-15 DIAGNOSIS — M9903 Segmental and somatic dysfunction of lumbar region: Secondary | ICD-10-CM | POA: Diagnosis not present

## 2020-06-15 DIAGNOSIS — M9901 Segmental and somatic dysfunction of cervical region: Secondary | ICD-10-CM | POA: Diagnosis not present

## 2020-06-15 DIAGNOSIS — M9902 Segmental and somatic dysfunction of thoracic region: Secondary | ICD-10-CM | POA: Diagnosis not present

## 2020-06-15 DIAGNOSIS — M9904 Segmental and somatic dysfunction of sacral region: Secondary | ICD-10-CM | POA: Diagnosis not present

## 2020-06-21 DIAGNOSIS — G4733 Obstructive sleep apnea (adult) (pediatric): Secondary | ICD-10-CM | POA: Diagnosis not present

## 2020-06-30 ENCOUNTER — Telehealth: Payer: Self-pay | Admitting: Sports Medicine

## 2020-06-30 ENCOUNTER — Ambulatory Visit: Payer: BLUE CROSS/BLUE SHIELD | Admitting: Sports Medicine

## 2020-06-30 NOTE — Telephone Encounter (Signed)
Yes, unfortunately because that is 30 minutes after his appointment time it is still a no-show.

## 2020-06-30 NOTE — Telephone Encounter (Signed)
Pt aware.

## 2020-06-30 NOTE — Telephone Encounter (Signed)
Pt called at 9:45 to cancel his appt. Thank you

## 2020-07-03 ENCOUNTER — Ambulatory Visit (INDEPENDENT_AMBULATORY_CARE_PROVIDER_SITE_OTHER): Payer: BLUE CROSS/BLUE SHIELD | Admitting: Sports Medicine

## 2020-07-03 ENCOUNTER — Encounter: Payer: Self-pay | Admitting: Sports Medicine

## 2020-07-03 ENCOUNTER — Other Ambulatory Visit: Payer: Self-pay

## 2020-07-03 DIAGNOSIS — E669 Obesity, unspecified: Secondary | ICD-10-CM

## 2020-07-03 DIAGNOSIS — F411 Generalized anxiety disorder: Secondary | ICD-10-CM | POA: Diagnosis not present

## 2020-07-03 MED ORDER — PHENTERMINE HCL 37.5 MG PO TABS: ORAL_TABLET | ORAL | 0 refills | Status: DC

## 2020-07-03 MED ORDER — ZOLPIDEM TARTRATE 10 MG PO TABS: 10.0000 mg | ORAL_TABLET | Freq: Every evening | ORAL | 3 refills | Status: DC | PRN

## 2020-07-03 NOTE — Assessment & Plan Note (Signed)
Leaving Lexapro alone, I did add Ambien for use at night, this has provided some really good improvement in his insomnia, refilling.

## 2020-07-03 NOTE — Progress Notes (Signed)
    Procedures performed today:    None.  Independent interpretation of notes and tests performed by another provider:   None.  Brief History, Exam, Impression, and Recommendations:    Obesity (BMI 30-39.9) 11 pound weight loss after the first month on phentermine, Kristopher Snyder did decline Wegovy or any other injectable, refilling, return to see me in a month. Weight is not yet at goal. Entering the second month.  Generalized anxiety disorder Leaving Lexapro alone, I did add Ambien for use at night, this has provided some really good improvement in his insomnia, refilling.    ___________________________________________ Kristopher Snyder. Kristopher Snyder, M.D., ABFM., CAQSM. Primary Care and Sports Medicine [facility]  Adjunct Instructor of Family Medicine  [employer]

## 2020-07-03 NOTE — Assessment & Plan Note (Addendum)
11 pound weight loss after the first month on phentermine, [place] did decline Wegovy or any other injectable, refilling, return to see me in a month. Weight is not yet at goal. Entering the second month.

## 2020-07-18 ENCOUNTER — Other Ambulatory Visit: Payer: Self-pay | Admitting: Sports Medicine

## 2020-07-31 DIAGNOSIS — G4733 Obstructive sleep apnea (adult) (pediatric): Secondary | ICD-10-CM | POA: Diagnosis not present

## 2020-08-01 ENCOUNTER — Other Ambulatory Visit: Payer: Self-pay

## 2020-08-01 ENCOUNTER — Encounter: Payer: Self-pay | Admitting: Sports Medicine

## 2020-08-01 ENCOUNTER — Ambulatory Visit (INDEPENDENT_AMBULATORY_CARE_PROVIDER_SITE_OTHER): Payer: BLUE CROSS/BLUE SHIELD | Admitting: Sports Medicine

## 2020-08-01 DIAGNOSIS — E669 Obesity, unspecified: Secondary | ICD-10-CM | POA: Diagnosis not present

## 2020-08-01 MED ORDER — PHENTERMINE HCL 37.5 MG PO TABS: ORAL_TABLET | ORAL | 0 refills | Status: DC

## 2020-08-01 NOTE — Assessment & Plan Note (Signed)
Kristopher Snyder returns, he had an 11 pound weight loss after the first month and a 3 pound weight gain this month, he blames it on Thanksgiving. Refilling phentermine, he understands he needs to lose weight over the next month we will discontinue. He did decline Wegovy and any other injectable.

## 2020-08-01 NOTE — Progress Notes (Signed)
    Procedures performed today:    None.  Independent interpretation of notes and tests performed by another provider:   None.  Brief History, Exam, Impression, and Recommendations:    Obesity (BMI 30-39.9) Bilal returns, he had an 11 pound weight loss after the first month and a 3 pound weight gain this month, he blames it on Thanksgiving. Refilling phentermine, he understands he needs to lose weight over the next month we will discontinue. He did decline Wegovy and any other injectable.    ___________________________________________ [name]. Benjamin Stain, M.D., ABFM., CAQSM. Primary Care and Sports Medicine [facility]  Adjunct Instructor of Family Medicine  [employer]

## 2020-08-14 ENCOUNTER — Other Ambulatory Visit: Payer: Self-pay | Admitting: Sports Medicine

## 2020-08-14 DIAGNOSIS — E785 Hyperlipidemia, unspecified: Secondary | ICD-10-CM

## 2020-08-14 MED ORDER — ATORVASTATIN CALCIUM 10 MG PO TABS: 10.0000 mg | ORAL_TABLET | Freq: Every day | ORAL | 3 refills | Status: DC

## 2020-08-24 ENCOUNTER — Ambulatory Visit: Payer: BLUE CROSS/BLUE SHIELD | Admitting: Sports Medicine

## 2021-01-17 ENCOUNTER — Other Ambulatory Visit: Payer: Self-pay | Admitting: Sports Medicine

## 2021-01-30 ENCOUNTER — Other Ambulatory Visit: Payer: Self-pay | Admitting: Sports Medicine

## 2021-01-30 DIAGNOSIS — F321 Major depressive disorder, single episode, moderate: Secondary | ICD-10-CM

## 2021-01-30 MED ORDER — ESCITALOPRAM OXALATE 10 MG PO TABS: 10.0000 mg | ORAL_TABLET | Freq: Every day | ORAL | 3 refills | Status: DC

## 2021-07-31 ENCOUNTER — Other Ambulatory Visit: Payer: Self-pay

## 2021-07-31 DIAGNOSIS — E785 Hyperlipidemia, unspecified: Secondary | ICD-10-CM

## 2021-07-31 MED ORDER — ATORVASTATIN CALCIUM 10 MG PO TABS: 10.0000 mg | ORAL_TABLET | Freq: Every day | ORAL | 0 refills | Status: DC

## 2021-07-31 MED ORDER — IBUPROFEN 800 MG PO TABS: 800.0000 mg | ORAL_TABLET | Freq: Three times a day (TID) | ORAL | 0 refills | Status: DC | PRN

## 2021-07-31 NOTE — Telephone Encounter (Signed)
Received fax for ibuprofen and atorvastatin . Giving 30 days but patient will need appt for physical . No labs since 2020

## 2021-07-31 NOTE — Telephone Encounter (Signed)
Patient has been scheduled for annual physical for 09/06/2021, said he needed something after the first of the year due to insurance. AM

## 2021-09-06 ENCOUNTER — Ambulatory Visit (INDEPENDENT_AMBULATORY_CARE_PROVIDER_SITE_OTHER): Payer: 59 | Admitting: Sports Medicine

## 2021-09-06 ENCOUNTER — Other Ambulatory Visit: Payer: Self-pay

## 2021-09-06 VITALS — BP 150/90 | HR 92 | Wt 295.0 lb

## 2021-09-06 DIAGNOSIS — E669 Obesity, unspecified: Secondary | ICD-10-CM

## 2021-09-06 DIAGNOSIS — R195 Other fecal abnormalities: Secondary | ICD-10-CM

## 2021-09-06 DIAGNOSIS — H6123 Impacted cerumen, bilateral: Secondary | ICD-10-CM

## 2021-09-06 DIAGNOSIS — E1165 Type 2 diabetes mellitus with hyperglycemia: Secondary | ICD-10-CM

## 2021-09-06 DIAGNOSIS — E785 Hyperlipidemia, unspecified: Secondary | ICD-10-CM

## 2021-09-06 DIAGNOSIS — F411 Generalized anxiety disorder: Secondary | ICD-10-CM

## 2021-09-06 DIAGNOSIS — N139 Obstructive and reflux uropathy, unspecified: Secondary | ICD-10-CM

## 2021-09-06 DIAGNOSIS — Z0000 Encounter for general adult medical examination without abnormal findings: Secondary | ICD-10-CM | POA: Diagnosis not present

## 2021-09-06 DIAGNOSIS — Z23 Encounter for immunization: Secondary | ICD-10-CM | POA: Diagnosis not present

## 2021-09-06 DIAGNOSIS — F321 Major depressive disorder, single episode, moderate: Secondary | ICD-10-CM

## 2021-09-06 DIAGNOSIS — H5213 Myopia, bilateral: Secondary | ICD-10-CM

## 2021-09-06 MED ORDER — MOUNJARO 2.5 MG/0.5ML ~~LOC~~ SOAJ: 2.5000 mg | SUBCUTANEOUS | 0 refills | Status: DC

## 2021-09-06 NOTE — Assessment & Plan Note (Signed)
Bilateral irrigation today.

## 2021-09-06 NOTE — Assessment & Plan Note (Signed)
Annual physical as above, flu shot today. Declines other vaccines for Checking routine labs.

## 2021-09-06 NOTE — Assessment & Plan Note (Signed)
Calling in [place], we can switch to [facility] if not covered. He will come in for nurse visit for his first injection.

## 2021-09-06 NOTE — Telephone Encounter (Signed)
Kristopher Snyder was here this morning. He is new to [facility] and called back asking [place] to resend in the [place]. He also needs refills on everything else .   Orders have been pended.

## 2021-09-06 NOTE — Assessment & Plan Note (Signed)
Desires referral for LASIK, sending to [facility].

## 2021-09-06 NOTE — Progress Notes (Addendum)
Subjective:    CC: Annual Physical Exam  HPI:  This patient is here for their annual physical  I reviewed the past medical history, family history, social history, surgical history, and allergies today and no changes were needed.  Please see the problem list section below in epic for further details.  Past Medical History: Past Medical History:  Diagnosis Date   Hyperlipidemia 11/24/2012   Past Surgical History: Past Surgical History:  Procedure Laterality Date   HEMORRHOID SURGERY     Social History: Social History   Socioeconomic History   Marital status: Single    Spouse name: Not on file   Number of children: Not on file   Years of education: Not on file   Highest education level: Not on file  Occupational History   Not on file  Tobacco Use   Smoking status: Former    Packs/day: 1.00    Years: 20.00    Pack years: 20.00    Types: Cigarettes    Start date: 09/02/1984    Quit date: 09/02/2004    Years since quitting: 17.1   Smokeless tobacco: Never  Substance and Sexual Activity   Alcohol use: No   Drug use: No   Sexual activity: Yes    Birth control/protection: Condom  Other Topics Concern   Not on file  Social History Narrative   Not on file   Social Determinants of Health   Financial Resource Strain: Not on file  Food Insecurity: Not on file  Transportation Needs: Not on file  Physical Activity: Not on file  Stress: Not on file  Social Connections: Not on file   Family History: Family History  Problem Relation Age of Onset   Heart disease Father    Heart disease Paternal Grandfather    Allergies: No Known Allergies Medications: See med rec.  Review of Systems: No headache, visual changes, nausea, vomiting, diarrhea, constipation, dizziness, abdominal pain, skin rash, fevers, chills, night sweats, swollen lymph nodes, weight loss, chest pain, body aches, joint swelling, muscle aches, shortness of breath, mood changes, visual or auditory  hallucinations.  Objective:    General: Well Developed, well nourished, and in no acute distress.  Neuro: Alert and oriented x3, extra-ocular muscles intact, sensation grossly intact. Cranial nerves II through XII are intact, motor, sensory, and coordinative functions are all intact. HEENT: Normocephalic, atraumatic, pupils equal round reactive to light, neck supple, no masses, no lymphadenopathy, thyroid nonpalpable. Oropharynx, nasopharynx, external ear canals are unremarkable. Skin: Warm and dry, no rashes noted.  Cardiac: Regular rate and rhythm, no murmurs rubs or gallops.  Respiratory: Clear to auscultation bilaterally. Not using accessory muscles, speaking in full sentences.  Abdominal: Soft, nontender, nondistended, positive bowel sounds, no masses, no organomegaly.  Musculoskeletal: Shoulder, elbow, wrist, hip, knee, ankle stable, and with full range of motion.  Indication: Cerumen impaction of the left and right ear(s) Medical necessity statement: On physical examination, cerumen impairs clinically significant portions of the external auditory canal, and tympanic membrane. Noted obstructive, copious cerumen that cannot be removed without magnification and instrumentations requiring physician skills Consent: Discussed benefits and risks of procedure and verbal consent obtained Procedure: Patient was prepped for the procedure. Utilized an otoscope to assess and take note of the ear canal, the tympanic membrane, and the presence, amount, and placement of the cerumen. Gentle water irrigation was utilized to remove cerumen.  Post procedure examination: shows cerumen was completely removed. Patient tolerated procedure well. The patient is made aware that they may experience  temporary vertigo, temporary hearing loss, and temporary discomfort. If these symptom last for more than 24 hours to call the clinic or proceed to the ED.  Impression and Recommendations:    The patient was counselled,  risk factors were discussed, anticipatory guidance given.  Annual physical exam Annual physical as above, flu shot today. Declines other vaccines for Checking routine labs.  Hyperlipidemia Rechecking labs, continue Lipitor.  Update: Uncontrolled elevated triglycerides, will bring his blood sugar under control and then recheck.  Obesity (BMI 30-39.9) Calling in [place], we can switch to [facility] if not covered. He will come in for nurse visit for his first injection.  Myopia of both eyes Desires referral for LASIK, sending to [facility].  Bilateral hearing loss due to cerumen impaction Bilateral irrigation today.  Uncontrolled type 2 diabetes mellitus with hyperglycemia (HCC) Hyperglycemia, suspect diabetes mellitus type 2. Adding an A1c to the blood in the lab, per insurance requirements they would prefer [place] to switch to Trulicity, adding low-dose Trulicity, he will still come in for a nurse visit to learn how to do the shots.  Elevated A1c confirms uncontrolled diabetes.  Positive colorectal cancer screening using Cologuard test Cologuard test positive/abnormal, needs colonoscopy.   ___________________________________________ Gwen Her. Dianah Field, M.D., ABFM., CAQSM. Primary Care and Sports Medicine [facility]  Adjunct Professor of [employer]

## 2021-09-06 NOTE — Addendum Note (Signed)
Addended by: Gust Brooms on: 09/06/2021 10:45 AM   Modules accepted: Orders

## 2021-09-06 NOTE — Telephone Encounter (Signed)
Patient called back this afternoon and needs a Assistant to call his pharmacy to see what is needed in order for him to pick up his meds from [place]. Please let patient know because he still has not been able to pick up his meds

## 2021-09-06 NOTE — Assessment & Plan Note (Addendum)
Rechecking labs, continue Lipitor.  Update: Uncontrolled elevated triglycerides, will bring his blood sugar under control and then recheck.

## 2021-09-07 DIAGNOSIS — E119 Type 2 diabetes mellitus without complications: Secondary | ICD-10-CM | POA: Insufficient documentation

## 2021-09-07 DIAGNOSIS — E1165 Type 2 diabetes mellitus with hyperglycemia: Secondary | ICD-10-CM | POA: Insufficient documentation

## 2021-09-07 MED ORDER — ALPRAZOLAM 2 MG PO TABS
2.0000 mg | ORAL_TABLET | Freq: Three times a day (TID) | ORAL | 2 refills | Status: DC
  Filled 2023-05-13: qty 90, 30d supply, fill #0

## 2021-09-07 MED ORDER — ZOLPIDEM TARTRATE 10 MG PO TABS: 10.0000 mg | ORAL_TABLET | Freq: Every evening | ORAL | 3 refills | Status: DC | PRN

## 2021-09-07 MED ORDER — TRULICITY 0.75 MG/0.5ML ~~LOC~~ SOAJ: 0.7500 mg | SUBCUTANEOUS | 0 refills | Status: DC

## 2021-09-07 MED ORDER — IBUPROFEN 800 MG PO TABS: 800.0000 mg | ORAL_TABLET | Freq: Three times a day (TID) | ORAL | 0 refills | Status: DC | PRN

## 2021-09-07 MED ORDER — MOUNJARO 2.5 MG/0.5ML ~~LOC~~ SOAJ: 2.5000 mg | SUBCUTANEOUS | 0 refills | Status: DC

## 2021-09-07 MED ORDER — ESCITALOPRAM OXALATE 10 MG PO TABS: 10.0000 mg | ORAL_TABLET | Freq: Every day | ORAL | 0 refills | Status: DC

## 2021-09-07 MED ORDER — ATORVASTATIN CALCIUM 10 MG PO TABS: 10.0000 mg | ORAL_TABLET | Freq: Every day | ORAL | 0 refills | Status: DC

## 2021-09-07 NOTE — Addendum Note (Signed)
Addended by: Monica Becton on: 09/07/2021 08:59 AM   Modules accepted: Orders

## 2021-09-07 NOTE — Assessment & Plan Note (Addendum)
Hyperglycemia, suspect diabetes mellitus type 2. Adding an A1c to the blood in the lab, per insurance requirements they would prefer [place] to switch to Trulicity, adding low-dose Trulicity, he will still come in for a nurse visit to learn how to do the shots.  Elevated A1c confirms uncontrolled diabetes.

## 2021-09-08 ENCOUNTER — Telehealth: Payer: Self-pay

## 2021-09-08 LAB — COMPREHENSIVE METABOLIC PANEL
AG Ratio: 1.1 (calc) (ref 1.0–2.5)
ALT: 107 U/L — ABNORMAL HIGH (ref 9–46)
AST: 76 U/L — ABNORMAL HIGH (ref 10–35)
Albumin: 4 g/dL (ref 3.6–5.1)
Alkaline phosphatase (APISO): 99 U/L (ref 35–144)
BUN: 17 mg/dL (ref 7–25)
CO2: 26 mmol/L (ref 20–32)
Calcium: 9.2 mg/dL (ref 8.6–10.3)
Chloride: 100 mmol/L (ref 98–110)
Creat: 0.86 mg/dL (ref 0.70–1.30)
Globulin: 3.7 g/dL (calc) (ref 1.9–3.7)
Glucose, Bld: 254 mg/dL — ABNORMAL HIGH (ref 65–99)
Potassium: 3.9 mmol/L (ref 3.5–5.3)
Sodium: 134 mmol/L — ABNORMAL LOW (ref 135–146)
Total Bilirubin: 0.8 mg/dL (ref 0.2–1.2)
Total Protein: 7.7 g/dL (ref 6.1–8.1)

## 2021-09-08 LAB — CBC
HCT: 46.1 % (ref 38.5–50.0)
Hemoglobin: 16 g/dL (ref 13.2–17.1)
MCH: 34.7 pg — ABNORMAL HIGH (ref 27.0–33.0)
MCHC: 34.7 g/dL (ref 32.0–36.0)
MCV: 100 fL (ref 80.0–100.0)
MPV: 11.1 fL (ref 7.5–12.5)
Platelets: 144 10*3/uL (ref 140–400)
RBC: 4.61 10*6/uL (ref 4.20–5.80)
RDW: 12.4 % (ref 11.0–15.0)
WBC: 4.8 10*3/uL (ref 3.8–10.8)

## 2021-09-08 LAB — LIPID PANEL
Cholesterol: 189 mg/dL (ref <200)
HDL: 30 mg/dL — ABNORMAL LOW (ref >=40)
Non-HDL Cholesterol (Calc): 159 mg/dL (calc) — ABNORMAL HIGH (ref <130)
Total CHOL/HDL Ratio: 6.3 (calc) — ABNORMAL HIGH (ref <5.0)
Triglycerides: 484 mg/dL — ABNORMAL HIGH (ref <150)

## 2021-09-08 LAB — PSA, TOTAL AND FREE
PSA, % Free: 67 % (calc) (ref >25)
PSA, Free: 0.2 ng/mL
PSA, Total: 0.3 ng/mL (ref <=4.0)

## 2021-09-08 LAB — HEMOGLOBIN A1C
Hgb A1c MFr Bld: 8.5 % of total Hgb — ABNORMAL HIGH (ref <5.7)
Mean Plasma Glucose: 197 mg/dL
eAG (mmol/L): 10.9 mmol/L

## 2021-09-08 LAB — TSH: TSH: 1.7 mIU/L (ref 0.40–4.50)

## 2021-09-08 NOTE — Telephone Encounter (Signed)
Medication: Dulaglutide (TRULICITY) [id number] SOPN Prior authorization submitted via CoverMyMeds on 09/08/2021 PA submission pending

## 2021-09-10 ENCOUNTER — Emergency Department
Admission: EM | Admit: 2021-09-10 | Discharge: 2021-09-10 | Disposition: A | Discharge status: Home / Self Care | Payer: 59 | Source: Home / Self Care

## 2021-09-10 ENCOUNTER — Telehealth: Payer: Self-pay

## 2021-09-10 ENCOUNTER — Other Ambulatory Visit: Payer: Self-pay

## 2021-09-10 DIAGNOSIS — R220 Localized swelling, mass and lump, head: Secondary | ICD-10-CM

## 2021-09-10 DIAGNOSIS — K047 Periapical abscess without sinus: Secondary | ICD-10-CM | POA: Diagnosis not present

## 2021-09-10 DIAGNOSIS — F5101 Primary insomnia: Secondary | ICD-10-CM

## 2021-09-10 MED ORDER — AMOXICILLIN-POT CLAVULANATE 875-125 MG PO TABS: 1.0000 | ORAL_TABLET | Freq: Two times a day (BID) | ORAL | 0 refills | Status: DC

## 2021-09-10 NOTE — Telephone Encounter (Signed)
Medication: zolpidem (AMBIEN) 10 MG tablet Prior authorization submitted via CoverMyMeds on 09/10/2021 PA submission pending

## 2021-09-10 NOTE — Telephone Encounter (Signed)
Medication: Dulaglutide (TRULICITY) [id number] SOPN Prior authorization determination received Medication has been approved Approval dates: 09/08/2021-09/08/2022  Patient aware via: phone  Pharmacy aware: Yes Provider aware via this encounter

## 2021-09-10 NOTE — ED Provider Notes (Signed)
[facility]    CSN: [id number] Arrival date & time: 09/10/21  0807      History   Chief Complaint Chief Complaint  Patient presents with   Facial Swelling    X3 days Facial swelling    HPI Kristopher Snyder is a 51 y.o. male.   HPI 51 year old male presents with dental abscess (right lower) and facial swelling for 3 days.  Past Medical History:  Diagnosis Date   Hyperlipidemia 11/24/2012    Patient Active Problem List   Diagnosis Date Noted   Uncontrolled type 2 diabetes mellitus with hyperglycemia (HCC) 09/07/2021   Myopia of both eyes 09/06/2021   Verruca 09/15/2019   Depression, major, single episode, moderate (HCC) 08/18/2019   Obstructive sleep apnea 08/18/2019   Rosacea 12/26/2017   External hemorrhoids 12/26/2017   Vertigo 08/07/2017   Generalized anxiety disorder 08/07/2017   Bilateral hearing loss due to cerumen impaction 08/07/2017   Primary osteoarthritis of right knee 07/10/2017   Degenerative disc disease, cervical 05/20/2016   Lumbar degenerative disc disease 05/20/2016   Esophageal reflux 02/06/2013   Obesity (BMI 30-39.9) 11/25/2012   Transaminitis 11/24/2012   Hyperlipidemia 11/24/2012   Annual physical exam 09/10/2012    Past Surgical History:  Procedure Laterality Date   HEMORRHOID SURGERY         Home Medications    Prior to Admission medications   Medication Sig Start Date End Date Taking? Authorizing Provider  alprazolam Prudy Feeler) 2 MG tablet Take 1 tablet (2 mg total) by mouth 3 (three) times daily. 09/07/21  Yes Monica Becton, MD  amoxicillin-clavulanate (AUGMENTIN) 875-125 MG tablet Take 1 tablet by mouth every 12 (twelve) hours. 09/10/21  Yes Trevor Iha, FNP  atorvastatin (LIPITOR) 10 MG tablet Take 1 tablet (10 mg total) by mouth daily. 09/07/21  Yes Monica Becton, MD  Dulaglutide (TRULICITY) 0.75 MG/0.5ML SOPN Inject 0.75 mg into the skin once a week. 09/07/21  Yes Monica Becton, MD  escitalopram  (LEXAPRO) 10 MG tablet Take 1 tablet (10 mg total) by mouth daily. 09/07/21  Yes Monica Becton, MD  ibuprofen (ADVIL) 800 MG tablet Take 1 tablet (800 mg total) by mouth 3 (three) times daily as needed. 09/07/21  Yes Monica Becton, MD  zolpidem (AMBIEN) 10 MG tablet Take 1 tablet (10 mg total) by mouth at bedtime as needed for sleep. 09/07/21 10/07/21 Yes Monica Becton, MD    Family History Family History  Problem Relation Age of Onset   Heart disease Father    Heart disease Paternal Grandfather     Social History Social History   Tobacco Use   Smoking status: Former    Packs/day: 1.00    Years: 20.00    Pack years: 20.00    Types: Cigarettes    Start date: 09/02/1984    Quit date: 09/02/2004    Years since quitting: 17.0   Smokeless tobacco: Never  Substance Use Topics   Alcohol use: No   Drug use: No     Allergies   Patient has no known allergies.   Review of Systems Review of Systems  HENT:  Positive for facial swelling.     Physical Exam Triage Vital Signs ED Triage Vitals  Enc Vitals Group     BP      Pulse      Resp      Temp      Temp src      SpO2  Weight      Height      Head Circumference      Peak Flow      Pain Score      Pain Loc      Pain Edu?      Excl. in GC?    No data found.  Updated Vital Signs BP (!) 160/94 (BP Location: Right Arm)    Pulse (!) 106    Temp 98.4 F (36.9 C) (Oral)    Resp 18    Ht 6\' 1"  (1.854 m)    Wt 295 lb (133.8 kg)    SpO2 95%    BMI 38.92 kg/m       Physical Exam Vitals and nursing note reviewed.  Constitutional:      Appearance: Normal appearance. He is obese.  HENT:     Head: Normocephalic and atraumatic.      Comments: Face (right-sided): Mild to moderate soft tissue swelling noted of inferior cheek    Right Ear: Tympanic membrane, ear canal and external ear normal.     Left Ear: Tympanic membrane, ear canal and external ear normal.     Mouth/Throat:     Mouth: Mucous  membranes are moist.     Pharynx: Oropharynx is clear.      Comments: Erythematous, indurated, fluctuant medial/lateral gingival lining above Eyes:     Extraocular Movements: Extraocular movements intact.     Conjunctiva/sclera: Conjunctivae normal.     Pupils: Pupils are equal, round, and reactive to light.  Cardiovascular:     Rate and Rhythm: Normal rate and regular rhythm.     Pulses: Normal pulses.     Heart sounds: Normal heart sounds.  Pulmonary:     Effort: Pulmonary effort is normal.     Breath sounds: Normal breath sounds.  Musculoskeletal:     Cervical back: Normal range of motion and neck supple.  Skin:    General: Skin is warm and dry.  Neurological:     General: No focal deficit present.     Mental Status: He is alert and oriented to person, place, and time.     UC Treatments / Results  Labs (all labs ordered are listed, but only abnormal results are displayed) Labs Reviewed - No data to display  EKG   Radiology No results found.  Procedures Procedures (including critical care time)  Medications Ordered in UC Medications - No data to display  Initial Impression / Assessment and Plan / UC Course  I have reviewed the triage vital signs and the nursing notes.  Pertinent labs & imaging results that were available during my care of the patient were reviewed by me and considered in my medical decision making (see chart for details).     MDM: 1.  Dental abscess-Rx'd Augmentin (printed) Advised/instructed patient to take medication as directed with food to completion.  Encouraged patient to follow-up with his dentist today or tomorrow for immediate evaluation of right-sided facial swelling and dental caries/dental abscess.  Encouraged patient to increase daily water intake while taking this medication.  Discharged home, hemodynamically stable. Final Clinical Impressions(s) / UC Diagnoses   Final diagnoses:  Facial swelling  Dental abscess     Discharge  Instructions      Advised/instructed patient to take medication as directed with food to completion.  Encouraged patient to follow-up with his dentist today or tomorrow for immediate evaluation of right-sided facial swelling and dental caries/dental abscess.  Encouraged patient to increase daily water intake  while taking this medication.     ED Prescriptions     Medication Sig Dispense Auth. Provider   amoxicillin-clavulanate (AUGMENTIN) 875-125 MG tablet Take 1 tablet by mouth every 12 (twelve) hours. 20 tablet Trevor Ihaagan, Colbey Wirtanen, FNP      PDMP not reviewed this encounter.   Trevor IhaRagan, Daryle Boyington, FNP 09/10/21 [phone]

## 2021-09-10 NOTE — ED Triage Notes (Signed)
Pt states that he has some facial swelling due to abscess tooth. X3 days

## 2021-09-10 NOTE — Discharge Instructions (Addendum)
Advised/instructed patient to take medication as directed with food to completion.  Encouraged patient to follow-up with his dentist today or tomorrow for immediate evaluation of right-sided facial swelling and dental caries/dental abscess.  Encouraged patient to increase daily water intake while taking this medication.

## 2021-09-17 DIAGNOSIS — G4700 Insomnia, unspecified: Secondary | ICD-10-CM | POA: Insufficient documentation

## 2021-09-17 NOTE — Telephone Encounter (Signed)
I added the diagnosis, in general when you see a prescription for Ambien you can assume that the patient has a diagnosis of insomnia.

## 2021-09-17 NOTE — Telephone Encounter (Signed)
Medication: zolpidem (AMBIEN) 10 MG tablet Prior authorization determination received Medication has been denied Reason for denial:  "The policy states that this medication may be approved when the member has a diagnosis of insomnia. The information provided to [place] indicates that you do not have a diagnosis of insomnia."

## 2021-09-21 ENCOUNTER — Other Ambulatory Visit: Payer: Self-pay

## 2021-09-21 DIAGNOSIS — Z1211 Encounter for screening for malignant neoplasm of colon: Secondary | ICD-10-CM

## 2021-09-30 DIAGNOSIS — Z1211 Encounter for screening for malignant neoplasm of colon: Secondary | ICD-10-CM | POA: Diagnosis not present

## 2021-10-09 ENCOUNTER — Other Ambulatory Visit: Payer: Self-pay

## 2021-10-09 DIAGNOSIS — R195 Other fecal abnormalities: Secondary | ICD-10-CM

## 2021-10-09 LAB — COLOGUARD: COLOGUARD: POSITIVE — AB

## 2021-10-09 NOTE — Assessment & Plan Note (Signed)
Cologuard test positive/abnormal, needs colonoscopy.

## 2021-10-09 NOTE — Addendum Note (Signed)
Addended by: Silverio Decamp on: 10/09/2021 10:15 AM   Modules accepted: Orders

## 2021-10-29 ENCOUNTER — Other Ambulatory Visit: Payer: Self-pay | Admitting: Sports Medicine

## 2021-10-29 DIAGNOSIS — E1165 Type 2 diabetes mellitus with hyperglycemia: Secondary | ICD-10-CM

## 2021-10-31 ENCOUNTER — Telehealth: Payer: Self-pay

## 2021-10-31 NOTE — Telephone Encounter (Signed)
Initiated Prior authorization [id number] 0.75MG /0.5ML pen-injectors Via: Covermymeds Case/Key: [id number] Status: Pending as of 10/31/21 Reason: Notified Pt via: pt does not have Mychart

## 2021-11-29 DIAGNOSIS — H25013 Cortical age-related cataract, bilateral: Secondary | ICD-10-CM | POA: Diagnosis not present

## 2021-11-29 DIAGNOSIS — H2513 Age-related nuclear cataract, bilateral: Secondary | ICD-10-CM | POA: Diagnosis not present

## 2021-12-03 ENCOUNTER — Other Ambulatory Visit: Payer: Self-pay | Admitting: Sports Medicine

## 2021-12-12 ENCOUNTER — Telehealth: Payer: Self-pay | Admitting: Sports Medicine

## 2021-12-12 ENCOUNTER — Encounter: Payer: Self-pay | Admitting: Emergency Medicine

## 2021-12-12 ENCOUNTER — Emergency Department
Admission: EM | Admit: 2021-12-12 | Discharge: 2021-12-12 | Disposition: A | Discharge status: Home / Self Care | Payer: 59 | Source: Home / Self Care

## 2021-12-12 DIAGNOSIS — K047 Periapical abscess without sinus: Secondary | ICD-10-CM | POA: Diagnosis not present

## 2021-12-12 DIAGNOSIS — E1165 Type 2 diabetes mellitus with hyperglycemia: Secondary | ICD-10-CM

## 2021-12-12 MED ORDER — AMOXICILLIN-POT CLAVULANATE 875-125 MG PO TABS: 1.0000 | ORAL_TABLET | Freq: Two times a day (BID) | ORAL | 0 refills | Status: AC

## 2021-12-12 NOTE — ED Provider Notes (Signed)
?[place] ? ? ? ?CSN: [id number] ?Arrival date & time: 12/12/21  [id number] ? ? ?  ? ?History   ?Chief Complaint ?Chief Complaint  ?Patient presents with  ? Dental Pain  ? ? ?HPI ?Kristopher Snyder is a 51 y.o. male.  ? ?HPI 51 year old male presents with dental pain right lower jaw started 3 days ago.  Patient presented for Augmentin last refilled on 09/10/2021 for same dental issue-patient evaluated by me on 09/10/2021.  Patient reports does not have dental insurance currently.  PMH significant for uncontrolled T2DM with hyperglycemia. ? ?Past Medical History:  ?Diagnosis Date  ? Hyperlipidemia 11/24/2012  ? ? ?Patient Active Problem List  ? Diagnosis Date Noted  ? Positive colorectal cancer screening using Cologuard test 10/09/2021  ? Insomnia 09/17/2021  ? Uncontrolled type 2 diabetes mellitus with hyperglycemia ([place]) 09/07/2021  ? Myopia of both eyes 09/06/2021  ? Verruca 09/15/2019  ? Depression, major, single episode, moderate ([place]) 08/18/2019  ? Obstructive sleep apnea 08/18/2019  ? Rosacea 12/26/2017  ? External hemorrhoids 12/26/2017  ? Vertigo 08/07/2017  ? Generalized anxiety disorder 08/07/2017  ? Bilateral hearing loss due to cerumen impaction 08/07/2017  ? Primary osteoarthritis of right knee 07/10/2017  ? Degenerative disc disease, cervical 05/20/2016  ? Lumbar degenerative disc disease 05/20/2016  ? Esophageal reflux 02/06/2013  ? Obesity (BMI 30-39.9) 11/25/2012  ? Transaminitis 11/24/2012  ? Hyperlipidemia 11/24/2012  ? Annual physical exam 09/10/2012  ? ? ?Past Surgical History:  ?Procedure Laterality Date  ? HEMORRHOID SURGERY    ? ? ? ? ? ?Home Medications   ? ?Prior to Admission medications   ?Medication Sig Start Date End Date Taking? Authorizing Provider  ?amoxicillin-clavulanate (AUGMENTIN) 875-125 MG tablet Take 1 tablet by mouth 2 (two) times daily for 10 days. 12/12/21 12/22/21 Yes Eliezer Lofts, FNP  ?alprazolam (XANAX) 2 MG tablet Take 1 tablet (2 mg total) by mouth 3 (three) times  daily. 09/07/21   Silverio Decamp, MD  ?atorvastatin (LIPITOR) 10 MG tablet Take 1 tablet (10 mg total) by mouth daily. 09/07/21   Silverio Decamp, MD  ?escitalopram (LEXAPRO) 10 MG tablet Take 1 tablet (10 mg total) by mouth daily. 09/07/21   Silverio Decamp, MD  ?ibuprofen (ADVIL) 800 MG tablet Take 1 tablet (800 mg total) by mouth 3 (three) times daily as needed. ?Patient not taking: Reported on 12/12/2021 09/07/21   Silverio Decamp, MD  ?TRULICITY [id number] 0000000 SOPN INJECT 0.75 MG INTO THE SKIN ONCE A WEEK. ?Patient not taking: Reported on 12/12/2021 10/29/21   Silverio Decamp, MD  ?zolpidem (AMBIEN) 10 MG tablet Take 1 tablet (10 mg total) by mouth at bedtime as needed for sleep. ?Patient not taking: Reported on 12/12/2021 09/07/21 10/07/21  Silverio Decamp, MD  ? ? ?Family History ?Family History  ?Problem Relation Age of Onset  ? Heart disease Father   ? Heart disease Paternal Grandfather   ? ? ?Social History ?Social History  ? ?Tobacco Use  ? Smoking status: Former  ?  Packs/day: 1.00  ?  Years: 20.00  ?  Pack years: 20.00  ?  Types: Cigarettes  ?  Start date: 09/02/1984  ?  Quit date: 09/02/2004  ?  Years since quitting: 17.2  ? Smokeless tobacco: Never  ?Vaping Use  ? Vaping Use: Never used  ?Substance Use Topics  ? Alcohol use: No  ? Drug use: No  ? ? ? ?Allergies   ?Patient has no known allergies. ? ? ?  Review of Systems ?Review of Systems  ?HENT:  Positive for dental problem.   ?All other systems reviewed and are negative. ? ? ?Physical Exam ?Triage Vital Signs ?ED Triage Vitals  ?Enc Vitals Group  ?   BP 12/12/21 0934 (!) 162/102  ?   Pulse Rate 12/12/21 0934 93  ?   Resp 12/12/21 0934 17  ?   Temp 12/12/21 0934 97.8 ?F (36.6 ?C)  ?   Temp src --   ?   SpO2 12/12/21 0934 97 %  ?   Weight 12/12/21 0938 260 lb (117.9 kg)  ?   Height 12/12/21 0938 6\' 1"  (1.854 m)  ?   Head Circumference --   ?   Peak Flow --   ?   Pain Score 12/12/21 0937 10  ?   Pain Loc --   ?   Pain Edu? --   ?    Excl. in [place]? --   ? ?No data found. ? ?Updated Vital Signs ?BP (!) 162/102 (BP Location: Left Arm)   Pulse 93   Temp 97.8 ?F (36.6 ?C)   Resp 17   Ht 6\' 1"  (1.854 m)   Wt 260 lb (117.9 kg)   SpO2 97%   BMI 34.30 kg/m?  ? ?Physical Exam ?Vitals and nursing note reviewed.  ?Constitutional:   ?   General: He is not in acute distress. ?   Appearance: Normal appearance. He is obese. He is not ill-appearing.  ?HENT:  ?   Head: Normocephalic and atraumatic.  ?   Mouth/Throat:  ?   Mouth: Mucous membranes are moist.  ?   Pharynx: Oropharynx is clear.  ?   Comments: Right lower teeth (right-sided):1st/2nd premolar, 1st molar erythematous, indurated, fluctuant medial/lateral gingival border noted ?Eyes:  ?   Extraocular Movements: Extraocular movements intact.  ?   Conjunctiva/sclera: Conjunctivae normal.  ?   Pupils: Pupils are equal, round, and reactive to light.  ?Cardiovascular:  ?   Rate and Rhythm: Normal rate and regular rhythm.  ?Pulmonary:  ?   Effort: Pulmonary effort is normal.  ?   Breath sounds: Normal breath sounds. No wheezing, rhonchi or rales.  ?Musculoskeletal:  ?   Cervical back: Normal range of motion and neck supple.  ?Skin: ?   General: Skin is warm and dry.  ?Neurological:  ?   General: No focal deficit present.  ?   Mental Status: He is alert and oriented to person, place, and time.  ? ? ? ?UC Treatments / Results  ?Labs ?(all labs ordered are listed, but only abnormal results are displayed) ?Labs Reviewed - No data to display ? ?EKG ? ? ?Radiology ?No results found. ? ?Procedures ?Procedures (including critical care time) ? ?Medications Ordered in UC ?Medications - No data to display ? ?Initial Impression / Assessment and Plan / UC Course  ?I have reviewed the triage vital signs and the nursing notes. ? ?Pertinent labs & imaging results that were available during my care of the patient were reviewed by me and considered in my medical decision making (see chart for details). ? ?  ? ?MDM: 2.   Dental abscess-Rx'd Augmentin. Advised patient to take medication as directed with food to completion.  Strongly encourage patient to follow-up with his dentist for further evaluation of right lower first second premolar and first molar dental abscesses ASAP.  Encouraged patient to increase daily water intake while taking these medications.  Patient discharged home, hemodynamically stable. ?Final Clinical Impressions(s) /  UC Diagnoses  ? ?Final diagnoses:  ?Dental abscess  ? ? ? ?Discharge Instructions   ? ?  ?Advised patient to take medication as directed with food to completion.  Strongly encouraged patient to follow-up with his dentist for further evaluation of right lower first second premolar and first molar dental abscesses ASAP.  Encouraged patient to increase daily water intake while taking these medications ? ? ? ? ?ED Prescriptions   ? ? Medication Sig Dispense Auth. Provider  ? amoxicillin-clavulanate (AUGMENTIN) 875-125 MG tablet Take 1 tablet by mouth 2 (two) times daily for 10 days. 20 tablet Eliezer Lofts, FNP  ? ?  ? ?PDMP not reviewed this encounter. ?  ?Eliezer Lofts, [place] ?12/12/21 1059 ? ?

## 2021-12-12 NOTE — ED Notes (Signed)
Pt given info on local dentists & an online option for a discount dental plan. RN also edicated pt on the importance of a follow up exam with his PCP. Pt's BP is elevated- no on BP meds. Pt also never started trulicity prescribed by PCP in January. Per pt, " I lost 30 lbs so I thought I didn't need it" RN edcucated pt on risks of untreated diabetes & encouraged pt to make an appointment w/ Dr T as soon as possible to determine if another medication can be used to treat the diabetes. Pt states he does not know how to use the pens prescribed. Pt also verbalized an understanding that he needed to follow up w/ the dentist and complete the dental plan as directed by the dentist.  ?

## 2021-12-12 NOTE — Telephone Encounter (Signed)
Patient called in wanting to Discuss diabetes and get blood work again. Pt stated he lost weight and want a second opinion on the diabetes diagnoses he received back in January. Per patient he has not taken the medication prescribed and been feeling okay. After these next set of labs patient would like to discuss results with Dr. Darene Lamer. Please advise.  ?

## 2021-12-12 NOTE — Discharge Instructions (Addendum)
Advised patient to take medication as directed with food to completion.  Strongly encouraged patient to follow-up with his dentist for further evaluation of right lower first second premolar and first molar dental abscesses ASAP.  Encouraged patient to increase daily water intake while taking these medications ?

## 2021-12-12 NOTE — ED Triage Notes (Signed)
Dental pain on Right lower jaw- started 3 days ago  ?Pt wanted to get antibiotics- augmentin last filled 09/11/2019 for same dental issue  ?Pt did follow up with a dentist - was supposed to have a root canal - not done  ?Pt does not have dental insurance  ?

## 2021-12-13 MED ORDER — AMOXICILLIN-POT CLAVULANATE 875-125 MG PO TABS: 1.0000 | ORAL_TABLET | Freq: Two times a day (BID) | ORAL | 0 refills | Status: DC

## 2021-12-13 NOTE — Telephone Encounter (Signed)
Not a problem at all, okay to get a second opinion regarding the diabetes, he can either see one of my partners or even see an endocrinologist, I will also go ahead and order all of his labs again which he should try to get fasting. ?

## 2021-12-13 NOTE — Telephone Encounter (Signed)
Patient made aware and will get labs drawn. Will decide from there about a second opinion.  ?

## 2021-12-13 NOTE — Addendum Note (Signed)
Addended by: Silverio Decamp on: 12/13/2021 11:33 AM ? ? Modules accepted: Orders ? ?

## 2021-12-20 DIAGNOSIS — R69 Illness, unspecified: Secondary | ICD-10-CM | POA: Diagnosis not present

## 2021-12-20 DIAGNOSIS — Z79899 Other long term (current) drug therapy: Secondary | ICD-10-CM | POA: Diagnosis not present

## 2021-12-25 ENCOUNTER — Ambulatory Visit: Payer: 59 | Admitting: Sports Medicine

## 2022-01-22 DIAGNOSIS — H18822 Corneal disorder due to contact lens, left eye: Secondary | ICD-10-CM | POA: Diagnosis not present

## 2022-03-14 ENCOUNTER — Ambulatory Visit: Payer: 59 | Admitting: Sports Medicine

## 2022-03-14 ENCOUNTER — Telehealth: Payer: Self-pay | Admitting: Sports Medicine

## 2022-03-14 NOTE — Telephone Encounter (Signed)
Pt called at 1:45. He thought his appointment was tomorrow.

## 2022-03-15 NOTE — Telephone Encounter (Signed)
He thought wrong.  Remind him most people have a cell phone with calendar to put their appointments in.

## 2022-03-20 ENCOUNTER — Ambulatory Visit: Payer: 59 | Admitting: Sports Medicine

## 2022-03-20 ENCOUNTER — Telehealth: Payer: Self-pay

## 2022-03-20 NOTE — Telephone Encounter (Signed)
Patient called to apologize for missing his appointment today. Patient has been having a hard time remembering his appointments. Patient informed to note next appointment in his phone calendar. Rescheduled to see provider on 03/27/22 at 945 am.

## 2022-03-25 ENCOUNTER — Other Ambulatory Visit: Payer: Self-pay | Admitting: Sports Medicine

## 2022-03-27 ENCOUNTER — Ambulatory Visit (INDEPENDENT_AMBULATORY_CARE_PROVIDER_SITE_OTHER): Payer: 59 | Admitting: Sports Medicine

## 2022-03-27 ENCOUNTER — Encounter: Payer: Self-pay | Admitting: Sports Medicine

## 2022-03-27 DIAGNOSIS — F321 Major depressive disorder, single episode, moderate: Secondary | ICD-10-CM

## 2022-03-27 DIAGNOSIS — E1165 Type 2 diabetes mellitus with hyperglycemia: Secondary | ICD-10-CM

## 2022-03-27 DIAGNOSIS — R69 Illness, unspecified: Secondary | ICD-10-CM | POA: Diagnosis not present

## 2022-03-27 DIAGNOSIS — E785 Hyperlipidemia, unspecified: Secondary | ICD-10-CM

## 2022-03-27 DIAGNOSIS — R195 Other fecal abnormalities: Secondary | ICD-10-CM | POA: Diagnosis not present

## 2022-03-27 LAB — POCT GLYCOSYLATED HEMOGLOBIN (HGB A1C): HbA1c, POC (controlled diabetic range): 9.1 % — AB (ref 0.0–7.0)

## 2022-03-27 MED ORDER — ATORVASTATIN CALCIUM 10 MG PO TABS: 10.0000 mg | ORAL_TABLET | Freq: Every day | ORAL | 3 refills | Status: DC

## 2022-03-27 MED ORDER — IBUPROFEN 800 MG PO TABS: 800.0000 mg | ORAL_TABLET | Freq: Three times a day (TID) | ORAL | 0 refills | Status: DC | PRN

## 2022-03-27 MED ORDER — TRULICITY 0.75 MG/0.5ML ~~LOC~~ SOAJ: 0.7500 mg | SUBCUTANEOUS | 3 refills | Status: DC

## 2022-03-27 MED ORDER — ESCITALOPRAM OXALATE 10 MG PO TABS: ORAL_TABLET | ORAL | 11 refills | Status: DC

## 2022-03-27 NOTE — Progress Notes (Signed)
    Procedures performed today:    None.  Independent interpretation of notes and tests performed by another provider:   None.  Brief History, Exam, Impression, and Recommendations:    Depression, major, single episode, moderate (HCC) We had started Lexapro back in 2021, unfortunately Kristopher Snyder stopped Lexapro, he is not surprising having worsening anxiety and depressive symptoms, insomnia. Restarting Lexapro, we can revisit this in about 6 weeks.  Positive colorectal cancer screening using Cologuard test Has still not gotten a colonoscopy, initial gastroenterology consultation was out of network, he will try to find out who is in network. He does understand that this needs to happen urgently because of the risk of colon cancer. The patient understands that the responsibility is on him to find a gastroenterologist in network and then I am happy to do the referral.  Uncontrolled type 2 diabetes mellitus with hyperglycemia (HCC) Kristopher Snyder returns, we initially had started Trulicity, Kristopher Snyder lost almost 20 pounds on his own, he has not started Trulicity. We will recheck his A1c today, and if it is looking better we may not start the Trulicity, if it is however not perfectly controlled then I would encourage him to restart the Trulicity and use it weekly.  Update: A1c has worsened, we will do his first Trulicity injection today and he will do it weekly and return in 3 months for repeat A1c  I spent 40 minutes of total time managing this patient today, this includes chart review, face to face, and non-face to face time.  Specifically we spent some time teaching him about diabetes and how to do the injections.   ____________________________________________ Kristopher Snyder. Kristopher Snyder, M.D., ABFM., CAQSM., AME. Primary Care and Sports Medicine [facility]  Adjunct Professor of Family Medicine  [place] of [facility] of Medicine  Restaurant manager, fast food

## 2022-03-27 NOTE — Assessment & Plan Note (Signed)
Has still not gotten a colonoscopy, initial gastroenterology consultation was out of network, he will try to find out who is in network. He does understand that this needs to happen urgently because of the risk of colon cancer. The patient understands that the responsibility is on him to find a gastroenterologist in network and then I am happy to do the referral.

## 2022-03-27 NOTE — Addendum Note (Signed)
Addended by: Carolin Coy on: 03/27/2022 11:32 AM   Modules accepted: Orders

## 2022-03-27 NOTE — Assessment & Plan Note (Signed)
We had started Lexapro back in 2021, unfortunately Kristopher Snyder stopped Lexapro, he is not surprising having worsening anxiety and depressive symptoms, insomnia. Restarting Lexapro, we can revisit this in about 6 weeks.

## 2022-03-27 NOTE — Assessment & Plan Note (Addendum)
Zakary returns, we initially had started Trulicity, Kristopher Snyder lost almost 20 pounds on his own, he has not started Trulicity. We will recheck his A1c today, and if it is looking better we may not start the Trulicity, if it is however not perfectly controlled then I would encourage him to restart the Trulicity and use it weekly.  Update: A1c has worsened, we will do his first Trulicity injection today and he will do it weekly and return in 3 months for repeat A1c

## 2022-03-28 LAB — CBC
HCT: 47.1 % (ref 38.5–50.0)
Hemoglobin: 16.3 g/dL (ref 13.2–17.1)
MCH: 35.1 pg — ABNORMAL HIGH (ref 27.0–33.0)
MCHC: 34.6 g/dL (ref 32.0–36.0)
MCV: 101.5 fL — ABNORMAL HIGH (ref 80.0–100.0)
MPV: 10.6 fL (ref 7.5–12.5)
Platelets: 146 10*3/uL (ref 140–400)
RBC: 4.64 10*6/uL (ref 4.20–5.80)
RDW: 12.5 % (ref 11.0–15.0)
WBC: 5.6 10*3/uL (ref 3.8–10.8)

## 2022-03-28 LAB — COMPLETE METABOLIC PANEL WITH GFR
AG Ratio: 1.1 (calc) (ref 1.0–2.5)
ALT: 109 U/L — ABNORMAL HIGH (ref 9–46)
AST: 69 U/L — ABNORMAL HIGH (ref 10–35)
Albumin: 4 g/dL (ref 3.6–5.1)
Alkaline phosphatase (APISO): 107 U/L (ref 35–144)
BUN/Creatinine Ratio: 31 (calc) — ABNORMAL HIGH (ref 6–22)
BUN: 20 mg/dL (ref 7–25)
CO2: 24 mmol/L (ref 20–32)
Calcium: 9.1 mg/dL (ref 8.6–10.3)
Chloride: 102 mmol/L (ref 98–110)
Creat: 0.65 mg/dL — ABNORMAL LOW (ref 0.70–1.30)
Globulin: 3.8 g/dL (calc) — ABNORMAL HIGH (ref 1.9–3.7)
Glucose, Bld: 226 mg/dL — ABNORMAL HIGH (ref 65–99)
Potassium: 4.1 mmol/L (ref 3.5–5.3)
Sodium: 137 mmol/L (ref 135–146)
Total Bilirubin: 0.8 mg/dL (ref 0.2–1.2)
Total Protein: 7.8 g/dL (ref 6.1–8.1)
eGFR: 114 mL/min/{1.73_m2} (ref >=60)

## 2022-03-28 LAB — MICROALBUMIN / CREATININE URINE RATIO
Creatinine, Urine: 254 mg/dL (ref 20–320)
Microalb Creat Ratio: 7 mcg/mg creat (ref <30)
Microalb, Ur: 1.9 mg/dL

## 2022-03-28 LAB — LIPID PANEL
Cholesterol: 162 mg/dL (ref <200)
HDL: 39 mg/dL — ABNORMAL LOW (ref >=40)
LDL Cholesterol (Calc): 85 mg/dL (calc)
Non-HDL Cholesterol (Calc): 123 mg/dL (calc) (ref <130)
Total CHOL/HDL Ratio: 4.2 (calc) (ref <5.0)
Triglycerides: 290 mg/dL — ABNORMAL HIGH (ref <150)

## 2022-03-28 LAB — TSH: TSH: 1.43 mIU/L (ref 0.40–4.50)

## 2022-04-02 ENCOUNTER — Other Ambulatory Visit: Payer: Self-pay

## 2022-04-02 ENCOUNTER — Telehealth: Payer: Self-pay | Admitting: Sports Medicine

## 2022-04-02 DIAGNOSIS — F321 Major depressive disorder, single episode, moderate: Secondary | ICD-10-CM

## 2022-04-02 DIAGNOSIS — E785 Hyperlipidemia, unspecified: Secondary | ICD-10-CM

## 2022-04-02 DIAGNOSIS — E1165 Type 2 diabetes mellitus with hyperglycemia: Secondary | ICD-10-CM

## 2022-04-02 MED ORDER — TRULICITY 0.75 MG/0.5ML ~~LOC~~ SOAJ: 0.7500 mg | SUBCUTANEOUS | 3 refills | Status: DC

## 2022-04-02 MED ORDER — ATORVASTATIN CALCIUM 10 MG PO TABS: 10.0000 mg | ORAL_TABLET | Freq: Every day | ORAL | 3 refills | Status: DC

## 2022-04-02 MED ORDER — IBUPROFEN 800 MG PO TABS: 800.0000 mg | ORAL_TABLET | Freq: Three times a day (TID) | ORAL | 0 refills | Status: DC | PRN

## 2022-04-02 MED ORDER — ESCITALOPRAM OXALATE 10 MG PO TABS: ORAL_TABLET | ORAL | 11 refills | Status: DC

## 2022-04-02 NOTE — Telephone Encounter (Signed)
Left msg that prescriptions were sent to [facility] on the day of his appt. Please check with the pharmacy.

## 2022-04-02 NOTE — Telephone Encounter (Signed)
Pt called. He wants scripts sent over to [facility] on [address].

## 2022-04-02 NOTE — Telephone Encounter (Signed)
Pt called. He has not gotten his medications.  He checked with his pharmacy on Sunday 7/30.

## 2022-04-21 ENCOUNTER — Other Ambulatory Visit: Payer: Self-pay | Admitting: Sports Medicine

## 2022-05-08 ENCOUNTER — Ambulatory Visit (INDEPENDENT_AMBULATORY_CARE_PROVIDER_SITE_OTHER): Payer: 59 | Admitting: Sports Medicine

## 2022-05-08 ENCOUNTER — Encounter: Payer: Self-pay | Admitting: Sports Medicine

## 2022-05-08 DIAGNOSIS — G4733 Obstructive sleep apnea (adult) (pediatric): Secondary | ICD-10-CM

## 2022-05-08 DIAGNOSIS — E1165 Type 2 diabetes mellitus with hyperglycemia: Secondary | ICD-10-CM | POA: Diagnosis not present

## 2022-05-08 DIAGNOSIS — R69 Illness, unspecified: Secondary | ICD-10-CM | POA: Diagnosis not present

## 2022-05-08 DIAGNOSIS — F321 Major depressive disorder, single episode, moderate: Secondary | ICD-10-CM | POA: Diagnosis not present

## 2022-05-08 MED ORDER — TRULICITY 1.5 MG/0.5ML ~~LOC~~ SOAJ: 1.5000 mg | SUBCUTANEOUS | 11 refills | Status: DC

## 2022-05-08 MED ORDER — ESCITALOPRAM OXALATE 10 MG PO TABS: ORAL_TABLET | ORAL | 11 refills | Status: DC

## 2022-05-08 NOTE — Assessment & Plan Note (Signed)
Slight subjective improvements on Lexapro 10, increasing to 20 mg. We can recheck this again in 6 weeks.

## 2022-05-08 NOTE — Progress Notes (Signed)
    Procedures performed today:    None.  Independent interpretation of notes and tests performed by another provider:   None.  Brief History, Exam, Impression, and Recommendations:    Depression, major, single episode, moderate (HCC) Slight subjective improvements on Lexapro 10, increasing to 20 mg. We can recheck this again in 6 weeks.  Uncontrolled type 2 diabetes mellitus with hyperglycemia (HCC) Kristopher Snyder had initially lost 20 pounds on his own, unfortunately his A1c increased, it was significantly uncontrolled the last visit, we started Trulicity, I am going to go ahead and increase his Trulicity dose to 1.5 mg weekly, he can double up on his 0.75s as he has 2 pens left and then switch to the higher dose. In 6 weeks it would be 3 months since his last A1c and we can recheck.  Obstructive sleep apnea Still not waking up feeling well rested, before we make any additional changes we will get him some good weight loss with the Trulicity.  I would also like to gain better control over his mood.  Chronic process not at goal with pharmacologic intervention  ____________________________________________ [name]. Benjamin Stain, M.D., ABFM., CAQSM., AME. Primary Care and Sports Medicine [place] [facility]  Adjunct Professor of Family Medicine  [place] of [facility] of Medicine  Restaurant manager, fast food

## 2022-05-08 NOTE — Assessment & Plan Note (Signed)
Kristopher Snyder had initially lost 20 pounds on his own, unfortunately his A1c increased, it was significantly uncontrolled the last visit, we started Trulicity, I am going to go ahead and increase his Trulicity dose to 1.5 mg weekly, he can double up on his 0.75s as he has 2 pens left and then switch to the higher dose. In 6 weeks it would be 3 months since his last A1c and we can recheck.

## 2022-05-08 NOTE — Assessment & Plan Note (Signed)
Still not waking up feeling well rested, before we make any additional changes we will get him some good weight loss with the Trulicity.  I would also like to gain better control over his mood.

## 2022-06-11 ENCOUNTER — Other Ambulatory Visit: Payer: Self-pay | Admitting: Sports Medicine

## 2022-06-11 DIAGNOSIS — F411 Generalized anxiety disorder: Secondary | ICD-10-CM

## 2022-06-11 NOTE — Addendum Note (Signed)
Addended by: Silverio Decamp on: 06/11/2022 10:00 AM   Modules accepted: Orders

## 2022-06-13 ENCOUNTER — Other Ambulatory Visit: Payer: Self-pay | Admitting: Sports Medicine

## 2022-06-13 DIAGNOSIS — Z79899 Other long term (current) drug therapy: Secondary | ICD-10-CM | POA: Diagnosis not present

## 2022-06-13 DIAGNOSIS — R69 Illness, unspecified: Secondary | ICD-10-CM | POA: Diagnosis not present

## 2022-06-19 ENCOUNTER — Encounter: Payer: Self-pay | Admitting: Sports Medicine

## 2022-06-19 ENCOUNTER — Ambulatory Visit (INDEPENDENT_AMBULATORY_CARE_PROVIDER_SITE_OTHER): Payer: 59 | Admitting: Sports Medicine

## 2022-06-19 VITALS — BP 131/94 | HR 78 | Ht 73.0 in | Wt 275.0 lb

## 2022-06-19 DIAGNOSIS — E119 Type 2 diabetes mellitus without complications: Secondary | ICD-10-CM | POA: Diagnosis not present

## 2022-06-19 DIAGNOSIS — E1165 Type 2 diabetes mellitus with hyperglycemia: Secondary | ICD-10-CM

## 2022-06-19 DIAGNOSIS — F321 Major depressive disorder, single episode, moderate: Secondary | ICD-10-CM

## 2022-06-19 DIAGNOSIS — L089 Local infection of the skin and subcutaneous tissue, unspecified: Secondary | ICD-10-CM | POA: Insufficient documentation

## 2022-06-19 DIAGNOSIS — R69 Illness, unspecified: Secondary | ICD-10-CM | POA: Diagnosis not present

## 2022-06-19 DIAGNOSIS — L723 Sebaceous cyst: Secondary | ICD-10-CM

## 2022-06-19 LAB — POCT GLYCOSYLATED HEMOGLOBIN (HGB A1C): Hemoglobin A1C: 5.4 % (ref 4.0–5.6)

## 2022-06-19 MED ORDER — TRULICITY 3 MG/0.5ML ~~LOC~~ SOAJ: 3.0000 mg | SUBCUTANEOUS | 11 refills | Status: DC

## 2022-06-19 MED ORDER — DOXYCYCLINE HYCLATE 100 MG PO TABS: 100.0000 mg | ORAL_TABLET | Freq: Two times a day (BID) | ORAL | 0 refills | Status: AC

## 2022-06-19 NOTE — Assessment & Plan Note (Signed)
Symptoms overall controlled, we did increase his Lexapro to 20 mg daily but he still has 10 mg in the chart, he will call me back and let me know if he is truly taking Lexapro 20 versus 10. He does have some tiredness in the morning but relatively good sleep hygiene. We will keep an eye on this.

## 2022-06-19 NOTE — Assessment & Plan Note (Signed)
Kristopher Snyder also has an infected sebaceous cyst right upper chest, I would like to do an excision of the cyst and its capsule contents but we will do a course of doxycycline first, have him come back in a week or so for a 30-minute slot for excision.

## 2022-06-19 NOTE — Assessment & Plan Note (Signed)
A1c has improved considerably from 6.4-6.8 with Trulicity 1.5 mg weekly, he is looking for additional weight loss so we will increase to 3 mg weekly. Return in 3 months for A1c check.

## 2022-06-19 NOTE — Progress Notes (Signed)
    Procedures performed today:    None.  Independent interpretation of notes and tests performed by another provider:   None.  Brief History, Exam, Impression, and Recommendations:    Depression, major, single episode, moderate (HCC) Symptoms overall controlled, we did increase his Lexapro to 20 mg daily but he still has 10 mg in the chart, he will call me back and let me know if he is truly taking Lexapro 20 versus 10. He does have some tiredness in the morning but relatively good sleep hygiene. We will keep an eye on this.  Controlled type 2 diabetes mellitus without complication, without long-term current use of insulin (HCC) A1c has improved considerably from 5.7-2.6 with Trulicity 1.5 mg weekly, he is looking for additional weight loss so we will increase to 3 mg weekly. Return in 3 months for A1c check.  Infected sebaceous cyst of skin, right upper chest Kristopher Snyder also has an infected sebaceous cyst right upper chest, I would like to do an excision of the cyst and its capsule contents but we will do a course of doxycycline first, have him come back in a week or so for a 30-minute slot for excision.    ____________________________________________ Gwen Her. Dianah Field, M.D., ABFM., CAQSM., AME. Primary Care and Sports Medicine [place]  Adjunct Professor of [place] of [facility] of Medicine  Risk manager

## 2022-06-21 ENCOUNTER — Telehealth: Payer: Self-pay

## 2022-06-21 NOTE — Telephone Encounter (Signed)
That means it was covered before and for some reason is no longer covered, I will route to [facility] to see if she has any ideas.

## 2022-06-21 NOTE — Telephone Encounter (Signed)
error 

## 2022-06-21 NOTE — Telephone Encounter (Signed)
Patient called and states you started him on Trulicity 3mg  he was on the 1.5 before and he states he was paying only 30 to 40 dollars for it and when you sent In the Trulicity 3mg  the pharmacy states its going to be 800 dollars and he wants to know what he should do.

## 2022-06-24 ENCOUNTER — Telehealth: Payer: Self-pay

## 2022-06-24 NOTE — Telephone Encounter (Addendum)
Initiated Prior authorization for: TRULICITY) 3 FF/6.3WG SOPN Via: Covermymeds Case/Key:[id number]  Status: n/a as of 06/24/22 Reason: pt has an active pa on file from 09/08/2021-09/08/2022   next fill date is nov 9th  pt has using 0.75 mg due nationwide back order pt is currently taking 0.75 and was advised to find a pharmacy that has stock of either thew 3 mg or 1.5 mg, pt acknowledge and agreed Notified Pt via: Pt does not have Mychart

## 2022-06-28 ENCOUNTER — Telehealth: Payer: Self-pay | Admitting: Sports Medicine

## 2022-06-28 NOTE — Telephone Encounter (Signed)
Pt aware that he must completed the doxycycline prior to having the procedure done. He stated that he just started the 7 day course today. Please call the pt to reschedule his appt with Dr. Darene Lamer for a 30 min appt (Cyst removal) for sometime the end of next week.

## 2022-06-28 NOTE — Telephone Encounter (Signed)
Must be the doxycycline, I sent it in on the 18th, he just needed to pick it up, if he has not then I can send it in again, and he needs to finish the doxycycline before we do the sebaceous cyst surgery.

## 2022-06-28 NOTE — Telephone Encounter (Signed)
Patient called has not received medication for a procedure he has on 07-01-22 would like to know if can still go thru with it patient would like a call back [phone].

## 2022-07-02 ENCOUNTER — Ambulatory Visit: Payer: 59 | Admitting: Sports Medicine

## 2022-07-08 ENCOUNTER — Ambulatory Visit: Payer: 59 | Admitting: Sports Medicine

## 2022-07-09 ENCOUNTER — Encounter: Payer: Self-pay | Admitting: Sports Medicine

## 2022-07-09 ENCOUNTER — Ambulatory Visit (INDEPENDENT_AMBULATORY_CARE_PROVIDER_SITE_OTHER): Payer: 59 | Admitting: Sports Medicine

## 2022-07-09 VITALS — Wt 277.0 lb

## 2022-07-09 DIAGNOSIS — L089 Local infection of the skin and subcutaneous tissue, unspecified: Secondary | ICD-10-CM | POA: Diagnosis not present

## 2022-07-09 DIAGNOSIS — L723 Sebaceous cyst: Secondary | ICD-10-CM | POA: Diagnosis not present

## 2022-07-09 MED ORDER — OXYCODONE-ACETAMINOPHEN 5-325 MG PO TABS: 1.0000 | ORAL_TABLET | Freq: Three times a day (TID) | ORAL | 0 refills | Status: DC | PRN

## 2022-07-09 NOTE — Progress Notes (Signed)
    Procedures performed today:    Procedure:  Excision of right axillary subcutaneous mass, 1 to 2 cm, question sebaceous cyst Risks, benefits, and alternatives explained and consent obtained. Time out conducted. Surface prepped with alcohol. 10cc lidocaine with epinephine infiltrated in a field block. Adequate anesthesia ensured. Area prepped and draped in a sterile fashion. Excision performed with: Using a #10 blade I made a linear incision into the subcutaneous tissues, then using blunt dissection I was able to remove what appeared to be remnant of an infected sebaceous cyst, there was some surrounding fat necrosis which was removed in a piecemeal fashion as well, the wound was inspected and revealed only glistening subcutaneous tissue, I then placed four 3-0 simple interrupted Ethilon sutures to close the incision. Hemostasis achieved. Pt stable.  Independent interpretation of notes and tests performed by another provider:   None.  Brief History, Exam, Impression, and Recommendations:    Infected sebaceous cyst of skin, right upper chest Surgical excision of infected sebaceous cyst right axilla, primary closure. Return to see me in 10 days for suture removal. Oxycodone for postop pain.    ____________________________________________ Gwen Her. Dianah Field, M.D., ABFM., CAQSM., AME. Primary Care and Sports Medicine [place] [facility]  Adjunct Professor of [place] of [facility] of Medicine  Risk manager

## 2022-07-09 NOTE — Patient Instructions (Signed)
Incision Care, Adult An incision is a surgical cut that is made through your skin. Most incisions are closed after a surgical procedure. Your incision may be closed with stitches (sutures), staples, skin glue, or adhesive strips. You may need to return to your health care provider to have sutures or staples removed. This may occur several days or several weeks after your surgery. Until then, the incision needs to be cared for properly to prevent infection. Follow instructions from your health care provider about how to care for your incision. Supplies needed: Soap, water, and a clean hand towel. Wound cleanser. A clean bandage (dressing), if needed. Cream or ointment, if told by your health care provider. Clean gauze. How to care for your incision Cleaning the incision Ask your health care provider how to clean the incision. This may include: Wearing medical gloves. Using mild soap and water, or wound cleanser. Using a clean gauze to pat the incision dry after cleaning it. Dressing changes Wash your hands with soap and water for at least 20 seconds before and after you change the dressing. If soap and water are not available, use hand sanitizer. Do not use disinfectants or antiseptics, such as rubbing alcohol, to clean open wounds unless told by your health care provider. Change your dressing as told by your health care provider. Leave sutures, staples, skin glue, or adhesive strips in place. These skin closures may need to stay in place for 2 weeks or longer. If adhesive strip edges start to loosen and curl up, you may trim the loose edges. Do not remove adhesive strips completely unless your health care provider tells you to do that. Apply cream or ointment. Do this only as told by your health care provider. Cover the incision with a clean dressing. Ask your health care provider when you can start to leave the incision uncovered. Checking for infection Check your incision area every day for  signs of infection. Check for: More redness, swelling, or pain. More fluid or blood. New warmth, hardness, or a rash that develops along the incision. Pus or a bad smell.  Follow these instructions at home Medicines Take over-the-counter and prescription medicines only as told by your health care provider. If you were prescribed an antibiotic medicine, cream, or ointment, take or apply it as told by your health care provider. Do not stop using the antibiotic even if your condition improves. Eating and drinking Eat a diet that includes protein, vitamin A, vitamin C, and other nutrient-rich foods to help the wound heal. Foods rich in protein include meat, fish, eggs, dairy, beans, nuts, and protein supplement drinks. Foods rich in vitamin A include carrots and dark green, leafy vegetables. Foods rich in vitamin C include citrus fruits, tomatoes, broccoli, and peppers. Drink enough fluid to keep your urine pale yellow. General instructions  Do not take baths, swim, use a hot tub, or do anything that would put the incision underwater until your health care provider approves. Ask your health care provider if you may take showers. You may only be allowed to take sponge baths. Limit movement around your incision to promote healing. Avoid straining, lifting, or exercising for the first 2 weeks after your procedure, or for as long as told by your health care provider. Return to your normal activities as told by your health care provider. Ask your health care provider what activities are safe for you. Do not scratch, pick, or scrub the incision. Keep it covered as told by your health care provider.   Protect your incision from the sun when you are outside for the first 6 months, or for as long as told by your health care provider. Cover up the scar area or apply sunscreen that has an SPF of at least 30. Do not use any products that contain nicotine or tobacco, such as cigarettes, e-cigarettes, and  chewing tobacco. These can delay incision healing. If you need help quitting, ask your health care provider. Keep all follow-up visits. This is important. Contact a health care provider if: You have any of these signs of infection: More redness, swelling, or pain around your incision. More fluid or blood coming from your incision. New warmth or hardness around your incision. Pus or a bad smell coming from your incision. A rash that develops along the incision. You feel nauseous or you vomit. You are dizzy. Your sutures, staples, skin glue, or adhesive strips come undone. Your wound gets bigger. You have a fever. Get help right away if: Your incision bleeds through the dressing and the bleeding does not stop with gentle pressure. The edges of your incision open up and separate. These symptoms may represent a serious problem that is an emergency. Do not wait to see if the symptoms will go away. Get medical help right away. Call your local emergency services (911 in the [place] Do not drive yourself to the hospital. Summary Follow instructions from your health care provider about how to care for your incision. Wash your hands with soap and water for at least 20 seconds before and after you change the dressing. If soap and water are not available, use hand sanitizer. Check your incision area every day for signs of infection. Keep all follow-up visits. This is important. This information is not intended to replace advice given to you by your health care provider. Make sure you discuss any questions you have with your health care provider. Document Revised: 11/20/2020 Document Reviewed: 11/20/2020 Elsevier Patient Education  2023 Elsevier Inc.  

## 2022-07-09 NOTE — Assessment & Plan Note (Signed)
Surgical excision of infected sebaceous cyst right axilla, primary closure. Return to see me in 10 days for suture removal. Oxycodone for postop pain.

## 2022-07-18 NOTE — Progress Notes (Signed)
   Acute Office Visit  Subjective:     Patient ID: Kristopher Snyder, male    DOB: 1971/05/19, 51 y.o.   MRN: [id number]  Chief Complaint  Patient presents with   Suture / Staple Removal    HPI Patient is in today for suture removal. He had sutures places for an infected sebaceous cyst in his right axilla.      Objective:    BP (!) 147/94   Pulse 81   Ht 6\' 1"  (1.854 m)   Wt 277 lb (125.6 kg)   SpO2 93%   BMI 36.55 kg/m   Procedure:  Area prepped with alcohol.  Suture removed No swelling or erythema of the area.      Assessment & Plan:   Problem List Items Addressed This Visit       Other   Visit for suture removal - Primary    - suture removal performed and pt tolerated procedure well.       No orders of the defined types were placed in this encounter.   Return if symptoms worsen or fail to improve.  , DO

## 2022-07-19 ENCOUNTER — Encounter: Payer: Self-pay | Admitting: Family Medicine

## 2022-07-19 ENCOUNTER — Ambulatory Visit (INDEPENDENT_AMBULATORY_CARE_PROVIDER_SITE_OTHER): Payer: 59 | Admitting: Family Medicine

## 2022-07-19 VITALS — BP 147/94 | HR 81 | Ht 73.0 in | Wt 277.0 lb

## 2022-07-19 DIAGNOSIS — Z4802 Encounter for removal of sutures: Secondary | ICD-10-CM | POA: Diagnosis not present

## 2022-07-19 NOTE — Assessment & Plan Note (Signed)
-   suture removal performed and pt tolerated procedure well.

## 2022-09-08 DIAGNOSIS — R443 Hallucinations, unspecified: Secondary | ICD-10-CM | POA: Diagnosis not present

## 2022-09-08 DIAGNOSIS — R918 Other nonspecific abnormal finding of lung field: Secondary | ICD-10-CM | POA: Diagnosis not present

## 2022-09-08 DIAGNOSIS — R69 Illness, unspecified: Secondary | ICD-10-CM | POA: Diagnosis not present

## 2022-09-08 DIAGNOSIS — Z4682 Encounter for fitting and adjustment of non-vascular catheter: Secondary | ICD-10-CM | POA: Diagnosis not present

## 2022-09-08 DIAGNOSIS — E872 Acidosis, unspecified: Secondary | ICD-10-CM | POA: Diagnosis not present

## 2022-09-08 DIAGNOSIS — J9601 Acute respiratory failure with hypoxia: Secondary | ICD-10-CM | POA: Diagnosis not present

## 2022-09-08 DIAGNOSIS — J690 Pneumonitis due to inhalation of food and vomit: Secondary | ICD-10-CM | POA: Diagnosis not present

## 2022-09-08 DIAGNOSIS — R509 Fever, unspecified: Secondary | ICD-10-CM | POA: Diagnosis not present

## 2022-09-09 DIAGNOSIS — X58XXXA Exposure to other specified factors, initial encounter: Secondary | ICD-10-CM | POA: Diagnosis not present

## 2022-09-09 DIAGNOSIS — Z1152 Encounter for screening for COVID-19: Secondary | ICD-10-CM | POA: Diagnosis not present

## 2022-09-09 DIAGNOSIS — Z9889 Other specified postprocedural states: Secondary | ICD-10-CM | POA: Diagnosis not present

## 2022-09-09 DIAGNOSIS — Y92230 Patient room in hospital as the place of occurrence of the external cause: Secondary | ICD-10-CM | POA: Diagnosis not present

## 2022-09-09 DIAGNOSIS — R836 Abnormal cytological findings in cerebrospinal fluid: Secondary | ICD-10-CM | POA: Diagnosis not present

## 2022-09-09 DIAGNOSIS — T17990A Other foreign object in respiratory tract, part unspecified in causing asphyxiation, initial encounter: Secondary | ICD-10-CM | POA: Diagnosis not present

## 2022-09-09 DIAGNOSIS — J9601 Acute respiratory failure with hypoxia: Secondary | ICD-10-CM | POA: Diagnosis not present

## 2022-09-09 DIAGNOSIS — F32A Depression, unspecified: Secondary | ICD-10-CM | POA: Diagnosis not present

## 2022-09-09 DIAGNOSIS — E876 Hypokalemia: Secondary | ICD-10-CM | POA: Diagnosis not present

## 2022-09-09 DIAGNOSIS — R7401 Elevation of levels of liver transaminase levels: Secondary | ICD-10-CM | POA: Diagnosis not present

## 2022-09-09 DIAGNOSIS — R918 Other nonspecific abnormal finding of lung field: Secondary | ICD-10-CM | POA: Diagnosis not present

## 2022-09-09 DIAGNOSIS — K7460 Unspecified cirrhosis of liver: Secondary | ICD-10-CM | POA: Diagnosis not present

## 2022-09-09 DIAGNOSIS — R69 Illness, unspecified: Secondary | ICD-10-CM | POA: Diagnosis not present

## 2022-09-09 DIAGNOSIS — R443 Hallucinations, unspecified: Secondary | ICD-10-CM | POA: Diagnosis not present

## 2022-09-09 DIAGNOSIS — J189 Pneumonia, unspecified organism: Secondary | ICD-10-CM | POA: Diagnosis not present

## 2022-09-09 DIAGNOSIS — F13939 Sedative, hypnotic or anxiolytic use, unspecified with withdrawal, unspecified: Secondary | ICD-10-CM | POA: Diagnosis not present

## 2022-09-09 DIAGNOSIS — Z978 Presence of other specified devices: Secondary | ICD-10-CM | POA: Diagnosis not present

## 2022-09-09 DIAGNOSIS — R9431 Abnormal electrocardiogram [ECG] [EKG]: Secondary | ICD-10-CM | POA: Diagnosis not present

## 2022-09-09 DIAGNOSIS — E8729 Other acidosis: Secondary | ICD-10-CM | POA: Diagnosis not present

## 2022-09-09 DIAGNOSIS — A419 Sepsis, unspecified organism: Secondary | ICD-10-CM | POA: Diagnosis not present

## 2022-09-09 DIAGNOSIS — J90 Pleural effusion, not elsewhere classified: Secondary | ICD-10-CM | POA: Diagnosis not present

## 2022-09-09 DIAGNOSIS — J811 Chronic pulmonary edema: Secondary | ICD-10-CM | POA: Diagnosis not present

## 2022-09-09 DIAGNOSIS — I517 Cardiomegaly: Secondary | ICD-10-CM | POA: Diagnosis not present

## 2022-09-09 DIAGNOSIS — E785 Hyperlipidemia, unspecified: Secondary | ICD-10-CM | POA: Diagnosis not present

## 2022-09-09 DIAGNOSIS — E119 Type 2 diabetes mellitus without complications: Secondary | ICD-10-CM | POA: Diagnosis not present

## 2022-09-09 DIAGNOSIS — R509 Fever, unspecified: Secondary | ICD-10-CM | POA: Diagnosis not present

## 2022-09-09 DIAGNOSIS — Z87891 Personal history of nicotine dependence: Secondary | ICD-10-CM | POA: Diagnosis not present

## 2022-09-09 DIAGNOSIS — J9811 Atelectasis: Secondary | ICD-10-CM | POA: Diagnosis not present

## 2022-09-09 DIAGNOSIS — E872 Acidosis, unspecified: Secondary | ICD-10-CM | POA: Diagnosis not present

## 2022-09-09 DIAGNOSIS — R0603 Acute respiratory distress: Secondary | ICD-10-CM | POA: Diagnosis not present

## 2022-09-09 DIAGNOSIS — I878 Other specified disorders of veins: Secondary | ICD-10-CM | POA: Diagnosis not present

## 2022-09-09 DIAGNOSIS — R590 Localized enlarged lymph nodes: Secondary | ICD-10-CM | POA: Diagnosis not present

## 2022-09-09 DIAGNOSIS — R0989 Other specified symptoms and signs involving the circulatory and respiratory systems: Secondary | ICD-10-CM | POA: Diagnosis not present

## 2022-09-09 DIAGNOSIS — F411 Generalized anxiety disorder: Secondary | ICD-10-CM | POA: Diagnosis not present

## 2022-09-09 DIAGNOSIS — N200 Calculus of kidney: Secondary | ICD-10-CM | POA: Diagnosis not present

## 2022-09-09 DIAGNOSIS — J690 Pneumonitis due to inhalation of food and vomit: Secondary | ICD-10-CM | POA: Diagnosis not present

## 2022-09-09 DIAGNOSIS — F10939 Alcohol use, unspecified with withdrawal, unspecified: Secondary | ICD-10-CM | POA: Diagnosis not present

## 2022-09-09 DIAGNOSIS — G9340 Encephalopathy, unspecified: Secondary | ICD-10-CM | POA: Diagnosis not present

## 2022-09-09 DIAGNOSIS — Y92019 Unspecified place in single-family (private) house as the place of occurrence of the external cause: Secondary | ICD-10-CM | POA: Diagnosis not present

## 2022-09-09 DIAGNOSIS — T424X6A Underdosing of benzodiazepines, initial encounter: Secondary | ICD-10-CM | POA: Diagnosis not present

## 2022-09-09 DIAGNOSIS — R7989 Other specified abnormal findings of blood chemistry: Secondary | ICD-10-CM | POA: Diagnosis not present

## 2022-09-09 DIAGNOSIS — F10131 Alcohol abuse with withdrawal delirium: Secondary | ICD-10-CM | POA: Diagnosis not present

## 2022-09-09 DIAGNOSIS — Z4682 Encounter for fitting and adjustment of non-vascular catheter: Secondary | ICD-10-CM | POA: Diagnosis not present

## 2022-09-09 DIAGNOSIS — R4182 Altered mental status, unspecified: Secondary | ICD-10-CM | POA: Diagnosis not present

## 2022-09-10 DIAGNOSIS — E872 Acidosis, unspecified: Secondary | ICD-10-CM | POA: Diagnosis not present

## 2022-09-10 DIAGNOSIS — F10939 Alcohol use, unspecified with withdrawal, unspecified: Secondary | ICD-10-CM | POA: Diagnosis not present

## 2022-09-10 DIAGNOSIS — J9601 Acute respiratory failure with hypoxia: Secondary | ICD-10-CM | POA: Diagnosis not present

## 2022-09-10 DIAGNOSIS — R9431 Abnormal electrocardiogram [ECG] [EKG]: Secondary | ICD-10-CM | POA: Diagnosis not present

## 2022-09-10 DIAGNOSIS — R443 Hallucinations, unspecified: Secondary | ICD-10-CM | POA: Diagnosis not present

## 2022-09-10 DIAGNOSIS — J690 Pneumonitis due to inhalation of food and vomit: Secondary | ICD-10-CM | POA: Diagnosis not present

## 2022-09-10 DIAGNOSIS — I878 Other specified disorders of veins: Secondary | ICD-10-CM | POA: Diagnosis not present

## 2022-09-10 DIAGNOSIS — R69 Illness, unspecified: Secondary | ICD-10-CM | POA: Diagnosis not present

## 2022-09-10 DIAGNOSIS — E8729 Other acidosis: Secondary | ICD-10-CM | POA: Diagnosis not present

## 2022-09-11 DIAGNOSIS — R4182 Altered mental status, unspecified: Secondary | ICD-10-CM | POA: Diagnosis not present

## 2022-09-11 DIAGNOSIS — J90 Pleural effusion, not elsewhere classified: Secondary | ICD-10-CM | POA: Diagnosis not present

## 2022-09-11 DIAGNOSIS — R7989 Other specified abnormal findings of blood chemistry: Secondary | ICD-10-CM | POA: Diagnosis not present

## 2022-09-11 DIAGNOSIS — R9431 Abnormal electrocardiogram [ECG] [EKG]: Secondary | ICD-10-CM | POA: Diagnosis not present

## 2022-09-11 DIAGNOSIS — I517 Cardiomegaly: Secondary | ICD-10-CM | POA: Diagnosis not present

## 2022-09-11 DIAGNOSIS — J189 Pneumonia, unspecified organism: Secondary | ICD-10-CM | POA: Diagnosis not present

## 2022-09-11 DIAGNOSIS — R443 Hallucinations, unspecified: Secondary | ICD-10-CM | POA: Diagnosis not present

## 2022-09-11 DIAGNOSIS — F10939 Alcohol use, unspecified with withdrawal, unspecified: Secondary | ICD-10-CM | POA: Diagnosis not present

## 2022-09-11 DIAGNOSIS — E8729 Other acidosis: Secondary | ICD-10-CM | POA: Diagnosis not present

## 2022-09-11 DIAGNOSIS — J690 Pneumonitis due to inhalation of food and vomit: Secondary | ICD-10-CM | POA: Diagnosis not present

## 2022-09-11 DIAGNOSIS — R69 Illness, unspecified: Secondary | ICD-10-CM | POA: Diagnosis not present

## 2022-09-11 DIAGNOSIS — E872 Acidosis, unspecified: Secondary | ICD-10-CM | POA: Diagnosis not present

## 2022-09-11 DIAGNOSIS — J9601 Acute respiratory failure with hypoxia: Secondary | ICD-10-CM | POA: Diagnosis not present

## 2022-09-11 DIAGNOSIS — R0989 Other specified symptoms and signs involving the circulatory and respiratory systems: Secondary | ICD-10-CM | POA: Diagnosis not present

## 2022-09-12 DIAGNOSIS — J189 Pneumonia, unspecified organism: Secondary | ICD-10-CM | POA: Diagnosis not present

## 2022-09-12 DIAGNOSIS — R443 Hallucinations, unspecified: Secondary | ICD-10-CM | POA: Diagnosis not present

## 2022-09-12 DIAGNOSIS — J9601 Acute respiratory failure with hypoxia: Secondary | ICD-10-CM | POA: Diagnosis not present

## 2022-09-12 DIAGNOSIS — J90 Pleural effusion, not elsewhere classified: Secondary | ICD-10-CM | POA: Diagnosis not present

## 2022-09-12 DIAGNOSIS — R69 Illness, unspecified: Secondary | ICD-10-CM | POA: Diagnosis not present

## 2022-09-12 DIAGNOSIS — I517 Cardiomegaly: Secondary | ICD-10-CM | POA: Diagnosis not present

## 2022-09-12 DIAGNOSIS — E872 Acidosis, unspecified: Secondary | ICD-10-CM | POA: Diagnosis not present

## 2022-09-12 DIAGNOSIS — Z978 Presence of other specified devices: Secondary | ICD-10-CM | POA: Diagnosis not present

## 2022-09-12 DIAGNOSIS — J690 Pneumonitis due to inhalation of food and vomit: Secondary | ICD-10-CM | POA: Diagnosis not present

## 2022-09-13 DIAGNOSIS — J690 Pneumonitis due to inhalation of food and vomit: Secondary | ICD-10-CM | POA: Diagnosis not present

## 2022-09-13 DIAGNOSIS — J9601 Acute respiratory failure with hypoxia: Secondary | ICD-10-CM | POA: Diagnosis not present

## 2022-09-13 DIAGNOSIS — E872 Acidosis, unspecified: Secondary | ICD-10-CM | POA: Diagnosis not present

## 2022-09-13 DIAGNOSIS — R69 Illness, unspecified: Secondary | ICD-10-CM | POA: Diagnosis not present

## 2022-09-13 DIAGNOSIS — F10939 Alcohol use, unspecified with withdrawal, unspecified: Secondary | ICD-10-CM | POA: Diagnosis not present

## 2022-09-13 DIAGNOSIS — I517 Cardiomegaly: Secondary | ICD-10-CM | POA: Diagnosis not present

## 2022-09-13 DIAGNOSIS — J811 Chronic pulmonary edema: Secondary | ICD-10-CM | POA: Diagnosis not present

## 2022-09-13 DIAGNOSIS — R443 Hallucinations, unspecified: Secondary | ICD-10-CM | POA: Diagnosis not present

## 2022-09-13 DIAGNOSIS — E119 Type 2 diabetes mellitus without complications: Secondary | ICD-10-CM | POA: Diagnosis not present

## 2022-09-14 DIAGNOSIS — I517 Cardiomegaly: Secondary | ICD-10-CM | POA: Diagnosis not present

## 2022-09-14 DIAGNOSIS — R69 Illness, unspecified: Secondary | ICD-10-CM | POA: Diagnosis not present

## 2022-09-14 DIAGNOSIS — R443 Hallucinations, unspecified: Secondary | ICD-10-CM | POA: Diagnosis not present

## 2022-09-14 DIAGNOSIS — F10939 Alcohol use, unspecified with withdrawal, unspecified: Secondary | ICD-10-CM | POA: Diagnosis not present

## 2022-09-14 DIAGNOSIS — R0603 Acute respiratory distress: Secondary | ICD-10-CM | POA: Diagnosis not present

## 2022-09-14 DIAGNOSIS — E872 Acidosis, unspecified: Secondary | ICD-10-CM | POA: Diagnosis not present

## 2022-09-14 DIAGNOSIS — R918 Other nonspecific abnormal finding of lung field: Secondary | ICD-10-CM | POA: Diagnosis not present

## 2022-09-14 DIAGNOSIS — J9601 Acute respiratory failure with hypoxia: Secondary | ICD-10-CM | POA: Diagnosis not present

## 2022-09-14 DIAGNOSIS — J690 Pneumonitis due to inhalation of food and vomit: Secondary | ICD-10-CM | POA: Diagnosis not present

## 2022-09-15 DIAGNOSIS — R69 Illness, unspecified: Secondary | ICD-10-CM | POA: Diagnosis not present

## 2022-09-15 DIAGNOSIS — E872 Acidosis, unspecified: Secondary | ICD-10-CM | POA: Diagnosis not present

## 2022-09-15 DIAGNOSIS — R443 Hallucinations, unspecified: Secondary | ICD-10-CM | POA: Diagnosis not present

## 2022-09-15 DIAGNOSIS — F10939 Alcohol use, unspecified with withdrawal, unspecified: Secondary | ICD-10-CM | POA: Diagnosis not present

## 2022-09-15 DIAGNOSIS — J690 Pneumonitis due to inhalation of food and vomit: Secondary | ICD-10-CM | POA: Diagnosis not present

## 2022-09-15 DIAGNOSIS — J9601 Acute respiratory failure with hypoxia: Secondary | ICD-10-CM | POA: Diagnosis not present

## 2022-09-16 DIAGNOSIS — J9811 Atelectasis: Secondary | ICD-10-CM | POA: Diagnosis not present

## 2022-09-16 DIAGNOSIS — E119 Type 2 diabetes mellitus without complications: Secondary | ICD-10-CM | POA: Diagnosis not present

## 2022-09-16 DIAGNOSIS — K7460 Unspecified cirrhosis of liver: Secondary | ICD-10-CM | POA: Diagnosis not present

## 2022-09-16 DIAGNOSIS — A419 Sepsis, unspecified organism: Secondary | ICD-10-CM | POA: Diagnosis not present

## 2022-09-16 DIAGNOSIS — R590 Localized enlarged lymph nodes: Secondary | ICD-10-CM | POA: Diagnosis not present

## 2022-09-16 DIAGNOSIS — F10939 Alcohol use, unspecified with withdrawal, unspecified: Secondary | ICD-10-CM | POA: Diagnosis not present

## 2022-09-16 DIAGNOSIS — J9601 Acute respiratory failure with hypoxia: Secondary | ICD-10-CM | POA: Diagnosis not present

## 2022-09-16 DIAGNOSIS — R69 Illness, unspecified: Secondary | ICD-10-CM | POA: Diagnosis not present

## 2022-09-16 DIAGNOSIS — N200 Calculus of kidney: Secondary | ICD-10-CM | POA: Diagnosis not present

## 2022-09-16 DIAGNOSIS — E872 Acidosis, unspecified: Secondary | ICD-10-CM | POA: Diagnosis not present

## 2022-09-16 DIAGNOSIS — R443 Hallucinations, unspecified: Secondary | ICD-10-CM | POA: Diagnosis not present

## 2022-09-16 DIAGNOSIS — J690 Pneumonitis due to inhalation of food and vomit: Secondary | ICD-10-CM | POA: Diagnosis not present

## 2022-09-16 DIAGNOSIS — J189 Pneumonia, unspecified organism: Secondary | ICD-10-CM | POA: Diagnosis not present

## 2022-09-17 DIAGNOSIS — F10939 Alcohol use, unspecified with withdrawal, unspecified: Secondary | ICD-10-CM | POA: Diagnosis not present

## 2022-09-17 DIAGNOSIS — R69 Illness, unspecified: Secondary | ICD-10-CM | POA: Diagnosis not present

## 2022-09-17 DIAGNOSIS — E872 Acidosis, unspecified: Secondary | ICD-10-CM | POA: Diagnosis not present

## 2022-09-17 DIAGNOSIS — J9601 Acute respiratory failure with hypoxia: Secondary | ICD-10-CM | POA: Diagnosis not present

## 2022-09-17 DIAGNOSIS — R443 Hallucinations, unspecified: Secondary | ICD-10-CM | POA: Diagnosis not present

## 2022-09-17 DIAGNOSIS — J690 Pneumonitis due to inhalation of food and vomit: Secondary | ICD-10-CM | POA: Diagnosis not present

## 2022-09-17 DIAGNOSIS — E119 Type 2 diabetes mellitus without complications: Secondary | ICD-10-CM | POA: Diagnosis not present

## 2022-09-18 DIAGNOSIS — J690 Pneumonitis due to inhalation of food and vomit: Secondary | ICD-10-CM | POA: Diagnosis not present

## 2022-09-18 DIAGNOSIS — R69 Illness, unspecified: Secondary | ICD-10-CM | POA: Diagnosis not present

## 2022-09-18 DIAGNOSIS — R443 Hallucinations, unspecified: Secondary | ICD-10-CM | POA: Diagnosis not present

## 2022-09-18 DIAGNOSIS — F10939 Alcohol use, unspecified with withdrawal, unspecified: Secondary | ICD-10-CM | POA: Diagnosis not present

## 2022-09-18 DIAGNOSIS — E872 Acidosis, unspecified: Secondary | ICD-10-CM | POA: Diagnosis not present

## 2022-09-18 DIAGNOSIS — J9601 Acute respiratory failure with hypoxia: Secondary | ICD-10-CM | POA: Diagnosis not present

## 2022-09-18 DIAGNOSIS — E119 Type 2 diabetes mellitus without complications: Secondary | ICD-10-CM | POA: Diagnosis not present

## 2022-09-19 ENCOUNTER — Ambulatory Visit: Payer: 59 | Admitting: Sports Medicine

## 2022-09-19 DIAGNOSIS — E872 Acidosis, unspecified: Secondary | ICD-10-CM | POA: Diagnosis not present

## 2022-09-19 DIAGNOSIS — J9601 Acute respiratory failure with hypoxia: Secondary | ICD-10-CM | POA: Diagnosis not present

## 2022-09-19 DIAGNOSIS — J690 Pneumonitis due to inhalation of food and vomit: Secondary | ICD-10-CM | POA: Diagnosis not present

## 2022-09-19 DIAGNOSIS — R69 Illness, unspecified: Secondary | ICD-10-CM | POA: Diagnosis not present

## 2022-09-19 DIAGNOSIS — R443 Hallucinations, unspecified: Secondary | ICD-10-CM | POA: Diagnosis not present

## 2022-09-20 ENCOUNTER — Telehealth: Payer: Self-pay | Admitting: General Practice

## 2022-09-20 NOTE — Telephone Encounter (Signed)
Transition Care Management Unsuccessful Follow-up Telephone Call  Date of discharge and from where:  09/19/22 from [facility]  Attempts:  1st Attempt  Reason for unsuccessful TCM follow-up call:  Left voice message

## 2022-09-20 NOTE — Telephone Encounter (Signed)
Transition Care Management Follow-up Telephone Call Date of discharge and from where: 09/19/22 from [place] How have you been since you were released from the hospital? He said that he is doing better but he is upset that he has not been able to get Trulicity. He had to cancel his appointment with PCP yesterday due to being in the hospital. Scheduled to see PCP on 09/24/22. Any questions or concerns? No  Items Reviewed: Did the pt receive and understand the discharge instructions provided? Yes  Medications obtained and verified? No  Other? No  Any new allergies since your discharge? No  Dietary orders reviewed? Yes Do you have support at home? Yes   Home Care and Equipment/Supplies: Were home health services ordered? no   Functional Questionnaire: (I = Independent and D = Dependent) ADLs: I  Bathing/Dressing- I  Meal Prep- I  Eating- I  Maintaining continence- I  Transferring/Ambulation- I  Managing Meds- I  Follow up appointments reviewed:  PCP Hospital f/u appt confirmed? Yes  Scheduled to see Dr. Darene Lamer on 09/24/22 @ 1030. [facility] f/u appt confirmed? No   Are transportation arrangements needed? No  If their condition worsens, is the pt aware to call PCP or go to the Emergency Dept.? Yes Was the patient provided with contact information for the PCP's office or ED? Yes Was to pt encouraged to call back with questions or concerns? Yes

## 2022-09-24 ENCOUNTER — Ambulatory Visit (INDEPENDENT_AMBULATORY_CARE_PROVIDER_SITE_OTHER): Payer: 59 | Admitting: Sports Medicine

## 2022-09-24 ENCOUNTER — Encounter: Payer: Self-pay | Admitting: Sports Medicine

## 2022-09-24 VITALS — BP 125/85 | HR 86 | Wt 270.0 lb

## 2022-09-24 DIAGNOSIS — F321 Major depressive disorder, single episode, moderate: Secondary | ICD-10-CM

## 2022-09-24 DIAGNOSIS — E119 Type 2 diabetes mellitus without complications: Secondary | ICD-10-CM | POA: Diagnosis not present

## 2022-09-24 DIAGNOSIS — E785 Hyperlipidemia, unspecified: Secondary | ICD-10-CM | POA: Diagnosis not present

## 2022-09-24 DIAGNOSIS — K7030 Alcoholic cirrhosis of liver without ascites: Secondary | ICD-10-CM

## 2022-09-24 DIAGNOSIS — E1165 Type 2 diabetes mellitus with hyperglycemia: Secondary | ICD-10-CM | POA: Diagnosis not present

## 2022-09-24 DIAGNOSIS — R69 Illness, unspecified: Secondary | ICD-10-CM | POA: Diagnosis not present

## 2022-09-24 DIAGNOSIS — K7460 Unspecified cirrhosis of liver: Secondary | ICD-10-CM | POA: Insufficient documentation

## 2022-09-24 DIAGNOSIS — F10931 Alcohol use, unspecified with withdrawal delirium: Secondary | ICD-10-CM

## 2022-09-24 MED ORDER — ESCITALOPRAM OXALATE 10 MG PO TABS: ORAL_TABLET | ORAL | 3 refills | Status: DC

## 2022-09-24 MED ORDER — OZEMPIC (0.25 OR 0.5 MG/DOSE) 2 MG/3ML ~~LOC~~ SOPN: 0.5000 mg | PEN_INJECTOR | SUBCUTANEOUS | 0 refills | Status: AC

## 2022-09-24 MED ORDER — TRULICITY 3 MG/0.5ML ~~LOC~~ SOAJ: 3.0000 mg | SUBCUTANEOUS | 11 refills | Status: DC

## 2022-09-24 MED ORDER — ATORVASTATIN CALCIUM 10 MG PO TABS
10.0000 mg | ORAL_TABLET | Freq: Every day | ORAL | 3 refills | Status: DC
  Filled 2023-05-10 – 2023-05-12 (×2): qty 30, 30d supply, fill #0
  Filled 2023-07-02: qty 30, 30d supply, fill #1
  Filled 2023-09-09: qty 30, 30d supply, fill #2

## 2022-09-24 NOTE — Progress Notes (Signed)
    Procedures performed today:    None.  Independent interpretation of notes and tests performed by another provider:   None.  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 He also had some periportal lymphadenopathy that was noted to need follow-up CT in a month. I would like him to touch base with gastroenterology for assistance.  Delirium tremens [facility]) This is a pleasant 52 year old male, it was noted that he has been drinking 3-6 shots of vodka per day. He decided to stop completely on September 02, 2022. Unfortunately he started to feel bad, ultimately started hallucinating, was taken to the emergency department where he was found to have cirrhosis, ultimately admitted to the hospital and delirium tremens, intubated. CIWA protocol. Improved, discharged and is here for further follow-up. He will no longer drink any alcohol per his report, we will recheck his labs including BMP, he was hypokalemic. Return to see me in a month.  I spent 40 minutes of total time managing this patient today, this includes chart review, face to face, and non-face to face time.  ____________________________________________ Kristopher Snyder. Dianah Field, M.D., ABFM., CAQSM., AME. Primary Care and Sports Medicine [facility]  Adjunct Professor of [place] of [facility] of Medicine  Risk manager

## 2022-09-24 NOTE — Assessment & Plan Note (Signed)
This is a pleasant 52 year old male, it was noted that he has been drinking 3-6 shots of vodka per day. He decided to stop completely on September 02, 2022. Unfortunately he started to feel bad, ultimately started hallucinating, was taken to the emergency department where he was found to have cirrhosis, ultimately admitted to the hospital and delirium tremens, intubated. CIWA protocol. Improved, discharged and is here for further follow-up. He will no longer drink any alcohol per his report, we will recheck his labs including BMP, he was hypokalemic. Return to see me in a month.

## 2022-09-24 NOTE — Assessment & Plan Note (Signed)
Kristopher Snyder was recently hospitalized for delirium tremens from alcohol withdrawal, he was noted to have a cirrhotic liver on imaging. He also had some periportal lymphadenopathy that was noted to need follow-up CT in a month. I would like him to touch base with gastroenterology for assistance.

## 2022-09-24 NOTE — Assessment & Plan Note (Signed)
A1c had improved considerably with the increase to 1.5 mg of Trulicity, we increased to 3 mg back in October. He was unable to get his Trulicity, I will send refills for him. We will give him Ozempic samples in the meantime.

## 2022-09-25 LAB — COMPLETE METABOLIC PANEL WITH GFR
AG Ratio: 1 (calc) (ref 1.0–2.5)
ALT: 69 U/L — ABNORMAL HIGH (ref 9–46)
AST: 49 U/L — ABNORMAL HIGH (ref 10–35)
Albumin: 3.7 g/dL (ref 3.6–5.1)
Alkaline phosphatase (APISO): 99 U/L (ref 35–144)
BUN/Creatinine Ratio: 17 (calc) (ref 6–22)
BUN: 11 mg/dL (ref 7–25)
CO2: 23 mmol/L (ref 20–32)
Calcium: 9 mg/dL (ref 8.6–10.3)
Chloride: 107 mmol/L (ref 98–110)
Creat: 0.65 mg/dL — ABNORMAL LOW (ref 0.70–1.30)
Globulin: 3.7 g/dL (calc) (ref 1.9–3.7)
Glucose, Bld: 92 mg/dL (ref 65–99)
Potassium: 4.8 mmol/L (ref 3.5–5.3)
Sodium: 140 mmol/L (ref 135–146)
Total Bilirubin: 0.6 mg/dL (ref 0.2–1.2)
Total Protein: 7.4 g/dL (ref 6.1–8.1)
eGFR: 114 mL/min/{1.73_m2} (ref >=60)

## 2022-09-25 LAB — CBC WITH DIFFERENTIAL/PLATELET
Absolute Monocytes: 458 cells/uL (ref 200–950)
Basophils Absolute: 49 cells/uL (ref 0–200)
Basophils Relative: 0.8 %
Eosinophils Absolute: 128 cells/uL (ref 15–500)
Eosinophils Relative: 2.1 %
HCT: 42.8 % (ref 38.5–50.0)
Hemoglobin: 15.2 g/dL (ref 13.2–17.1)
Lymphs Abs: 1501 cells/uL (ref 850–3900)
MCH: 35.4 pg — ABNORMAL HIGH (ref 27.0–33.0)
MCHC: 35.5 g/dL (ref 32.0–36.0)
MCV: 99.8 fL (ref 80.0–100.0)
MPV: 10.5 fL (ref 7.5–12.5)
Monocytes Relative: 7.5 %
Neutro Abs: 3965 cells/uL (ref 1500–7800)
Neutrophils Relative %: 65 %
Platelets: 240 10*3/uL (ref 140–400)
RBC: 4.29 10*6/uL (ref 4.20–5.80)
RDW: 12.3 % (ref 11.0–15.0)
Total Lymphocyte: 24.6 %
WBC: 6.1 10*3/uL (ref 3.8–10.8)

## 2022-09-25 LAB — HEMOGLOBIN A1C
Hgb A1c MFr Bld: 6.1 % of total Hgb — ABNORMAL HIGH (ref <5.7)
Mean Plasma Glucose: 128 mg/dL
eAG (mmol/L): 7.1 mmol/L

## 2022-10-04 ENCOUNTER — Telehealth: Payer: Self-pay

## 2022-10-04 NOTE — Telephone Encounter (Signed)
Good advice, thank you.

## 2022-10-04 NOTE — Telephone Encounter (Signed)
Patient called with symptoms of diarrhea for 2 weeks. Denies chronic diarrhea, recent change in diet, family members with diarrhea, recent travel to a foreign country, fever unresponsive to fever-reducing measures, or blood in stool. He states he has had a change in medication. He is on Ozempic instead of Trulicity.   I advised him to try Imodium. I have scheduled him for Monday just in case the Imodium doesn't help the diarrhea. He will cancel if not needed.   Loyal Gambler is working on the PA for the [employer].

## 2022-10-07 ENCOUNTER — Ambulatory Visit: Payer: 59 | Admitting: Sports Medicine

## 2022-10-17 ENCOUNTER — Encounter: Payer: Self-pay | Admitting: Sports Medicine

## 2022-10-17 ENCOUNTER — Ambulatory Visit (INDEPENDENT_AMBULATORY_CARE_PROVIDER_SITE_OTHER): Payer: 59 | Admitting: Sports Medicine

## 2022-10-17 VITALS — BP 151/54 | HR 84 | Wt 262.0 lb

## 2022-10-17 DIAGNOSIS — A0472 Enterocolitis due to Clostridium difficile, not specified as recurrent: Secondary | ICD-10-CM | POA: Diagnosis not present

## 2022-10-17 DIAGNOSIS — R197 Diarrhea, unspecified: Secondary | ICD-10-CM | POA: Diagnosis not present

## 2022-10-17 MED ORDER — DIPHENOXYLATE-ATROPINE 2.5-0.025 MG PO TABS: ORAL_TABLET | ORAL | 3 refills | Status: DC

## 2022-10-17 NOTE — Assessment & Plan Note (Addendum)
52 year old male, recent hospital discharge for delirium tremens, currently doing Ozempic. Having some diarrhea over the past couple of weeks, watery diarrhea multiple times per day, no blood or mucus. Abdomen is soft. Adding Lomotil, stool studies, he does have a consultation coming up with gastroenterology.  Update: C. difficile colitis noted, first episode, vancomycin for 10 days.

## 2022-10-17 NOTE — Progress Notes (Addendum)
    Procedures performed today:    None.  Independent interpretation of notes and tests performed by another provider:   None.  Brief History, Exam, Impression, and Recommendations:    C. difficile diarrhea 52 year old male, recent hospital discharge for delirium tremens, currently doing Ozempic. Having some diarrhea over the past couple of weeks, watery diarrhea multiple times per day, no blood or mucus. Abdomen is soft. Adding Lomotil, stool studies, he does have a consultation coming up with gastroenterology.  Update: C. difficile colitis noted, first episode, vancomycin for 10 days.    ____________________________________________ Gwen Her. Dianah Field, M.D., ABFM., CAQSM., AME. Primary Care and Sports Medicine [facility]  Adjunct Professor of [place] of [facility] of Medicine  Risk manager

## 2022-10-18 DIAGNOSIS — R197 Diarrhea, unspecified: Secondary | ICD-10-CM | POA: Diagnosis not present

## 2022-10-21 MED ORDER — VANCOMYCIN HCL 125 MG PO CAPS: 125.0000 mg | ORAL_CAPSULE | Freq: Four times a day (QID) | ORAL | 0 refills | Status: AC

## 2022-10-21 NOTE — Addendum Note (Signed)
Addended by: Silverio Decamp on: 10/21/2022 04:04 PM   Modules accepted: Orders

## 2022-10-22 ENCOUNTER — Telehealth: Payer: Self-pay | Admitting: Sports Medicine

## 2022-10-22 ENCOUNTER — Ambulatory Visit: Payer: 59 | Admitting: Sports Medicine

## 2022-10-22 LAB — SALMONELLA/SHIGELLA CULT, CAMPY EIA AND SHIGA TOXIN RFL ECOLI
MICRO NUMBER: 14579211
MICRO NUMBER:: 14579212
MICRO NUMBER:: 14579213
Result:: NOT DETECTED
SHIGA RESULT:: NOT DETECTED
SPECIMEN QUALITY: ADEQUATE
SPECIMEN QUALITY:: ADEQUATE
SPECIMEN QUALITY:: ADEQUATE

## 2022-10-22 LAB — C. DIFFICILE GDH AND TOXIN A/B
GDH ANTIGEN: DETECTED
MICRO NUMBER:: 14576337
SPECIMEN QUALITY:: ADEQUATE
TOXIN A AND B: DETECTED

## 2022-10-22 LAB — OVA AND PARASITE EXAMINATION
CONCENTRATE RESULT:: NONE SEEN
MICRO NUMBER:: 14576370
SPECIMEN QUALITY:: ADEQUATE
TRICHROME RESULT:: NONE SEEN

## 2022-10-22 NOTE — Telephone Encounter (Signed)
Pt stated he was unable to pick up his Dulaglutide (TRULICITY) 3 0000000 SOPN [id number] prescription. Stated that the pharmacy was waiting on conformation from the doctor.

## 2022-10-22 NOTE — Telephone Encounter (Signed)
Hi Kristopher Snyder, please call the pharmacy and tell them that I sent it back on 23 January with 11 refills.

## 2022-12-05 DIAGNOSIS — F411 Generalized anxiety disorder: Secondary | ICD-10-CM | POA: Diagnosis not present

## 2022-12-10 ENCOUNTER — Telehealth: Payer: Self-pay

## 2022-12-10 NOTE — Telephone Encounter (Signed)
Trulicity required a PA. PA approved 12/10/22 - 12/10/23.

## 2022-12-23 ENCOUNTER — Telehealth: Payer: Self-pay | Admitting: Sports Medicine

## 2022-12-23 NOTE — Telephone Encounter (Signed)
Pt called for an appointment.  He stated he had been drinking but he had stopped and he was also off his meds at one point.  He has scheduled an appointment for not feeling motivated and hallucinations. Spoke to Dr T and he recommended that I speak to triage to get more information about patient having hallucination, I.e. suicidal ideation.

## 2022-12-27 ENCOUNTER — Ambulatory Visit: Payer: 59 | Admitting: Sports Medicine

## 2023-01-06 ENCOUNTER — Telehealth: Payer: Self-pay

## 2023-01-06 ENCOUNTER — Other Ambulatory Visit: Payer: Self-pay | Admitting: Sports Medicine

## 2023-01-06 DIAGNOSIS — E1165 Type 2 diabetes mellitus with hyperglycemia: Secondary | ICD-10-CM

## 2023-01-06 DIAGNOSIS — E119 Type 2 diabetes mellitus without complications: Secondary | ICD-10-CM

## 2023-01-06 NOTE — Telephone Encounter (Signed)
Kristopher Snyder called and is very anxious because he cannot get Trulicity due to backorder. He states he will not call other pharmacies that we should call around to find the medication. He is requesting a different medication that doesn't have backorder problems.

## 2023-01-06 NOTE — Telephone Encounter (Signed)
They all have backorder problems, all GLP-1's.  We can dispense a few samples of Ozempic that we have here and that can hold him over until his pharmacy can get Trulicity back.  Please give him 2 samples and he can do the full 0.5 mg weekly.

## 2023-01-07 NOTE — Telephone Encounter (Signed)
Patient advised. Samples has his name on box.

## 2023-01-15 ENCOUNTER — Other Ambulatory Visit: Payer: Self-pay | Admitting: Sports Medicine

## 2023-01-15 ENCOUNTER — Ambulatory Visit: Payer: 59 | Admitting: Sports Medicine

## 2023-01-15 ENCOUNTER — Ambulatory Visit (INDEPENDENT_AMBULATORY_CARE_PROVIDER_SITE_OTHER): Payer: 59 | Admitting: Sports Medicine

## 2023-01-15 ENCOUNTER — Encounter: Payer: Self-pay | Admitting: Sports Medicine

## 2023-01-15 VITALS — BP 122/85 | HR 80 | Ht 73.0 in | Wt 281.0 lb

## 2023-01-15 DIAGNOSIS — F321 Major depressive disorder, single episode, moderate: Secondary | ICD-10-CM | POA: Diagnosis not present

## 2023-01-15 DIAGNOSIS — A0472 Enterocolitis due to Clostridium difficile, not specified as recurrent: Secondary | ICD-10-CM | POA: Diagnosis not present

## 2023-01-15 DIAGNOSIS — E119 Type 2 diabetes mellitus without complications: Secondary | ICD-10-CM

## 2023-01-15 DIAGNOSIS — F419 Anxiety disorder, unspecified: Secondary | ICD-10-CM | POA: Diagnosis not present

## 2023-01-15 DIAGNOSIS — M5136 Other intervertebral disc degeneration, lumbar region: Secondary | ICD-10-CM

## 2023-01-15 DIAGNOSIS — F32A Depression, unspecified: Secondary | ICD-10-CM | POA: Diagnosis not present

## 2023-01-15 DIAGNOSIS — M51369 Other intervertebral disc degeneration, lumbar region without mention of lumbar back pain or lower extremity pain: Secondary | ICD-10-CM

## 2023-01-15 MED ORDER — ESCITALOPRAM OXALATE 20 MG PO TABS
20.0000 mg | ORAL_TABLET | Freq: Every day | ORAL | 8 refills | Status: DC
  Filled 2023-05-10 – 2023-05-12 (×2): qty 30, 30d supply, fill #0
  Filled 2023-07-02: qty 30, 30d supply, fill #1
  Filled 2023-08-05: qty 30, 30d supply, fill #2
  Filled 2023-09-09: qty 30, 30d supply, fill #3
  Filled 2023-10-06: qty 30, 30d supply, fill #4
  Filled 2023-11-03: qty 30, 30d supply, fill #5
  Filled 2023-12-02: qty 30, 30d supply, fill #6
  Filled 2023-12-29: qty 30, 30d supply, fill #7
  Filled ????-??-??: fill #4

## 2023-01-15 MED ORDER — OZEMPIC (1 MG/DOSE) 4 MG/3ML ~~LOC~~ SOPN: 1.0000 mg | PEN_INJECTOR | SUBCUTANEOUS | 0 refills | Status: DC

## 2023-01-15 MED ORDER — PREDNISONE 50 MG PO TABS: ORAL_TABLET | ORAL | 0 refills | Status: DC

## 2023-01-15 MED ORDER — MOUNJARO 10 MG/0.5ML ~~LOC~~ SOAJ: 10.0000 mg | SUBCUTANEOUS | 0 refills | Status: DC

## 2023-01-15 NOTE — Assessment & Plan Note (Signed)
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 Return to see me in a month and we can consider additional treatment such as Neurontin versus new MRI if not better.

## 2023-01-15 NOTE — Assessment & Plan Note (Signed)
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 Return to see me in 6 weeks for this.

## 2023-01-15 NOTE — Progress Notes (Signed)
    Procedures performed today:    None.  Independent interpretation of notes and tests performed by another provider:   None.  Brief History, Exam, Impression, and Recommendations:    C. difficile diarrhea Riyan had some diarrhea after a hospital discharge in February, unfortunately C. difficile diarrhea was noted with positive toxins. We added vancomycin for 10 days but he did not finish the course of antibiotics, still has some frequent stools, he does need another C. difficile test and if positive he will need another course of vancomycin, I emphasized the importance of finishing his of antibiotics when he gets them.  Controlled type 2 diabetes mellitus without complication, without long-term current use of insulin (HCC) Has been unable to get Trulicity, switching to Ozempic dose, we did give him some samples to hold him over in the meantime.  We talked extensively for over an hour about Pharrell's bills and medication shortages. It sounds like he just got a notice from his pharmacy that says Ozempic is not available, Trulicity not available, we will try Mounjaro.  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 Return to see me in a month and we can consider additional treatment such as Neurontin versus new MRI if not better.  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 Return to see me in 6 weeks for this.  I spent 40 minutes of total time managing this patient today, this includes chart review, face to face, and non-face to face time.  ____________________________________________ [name]. Benjamin Stain, M.D., ABFM., CAQSM., AME. Primary Care and Sports  Medicine [facility]  Adjunct Professor of Family Medicine  [place] of [facility] of Medicine  Restaurant manager, fast food

## 2023-01-15 NOTE — Assessment & Plan Note (Addendum)
Has been unable to get Trulicity, switching to Ozempic dose, we did give him some samples to hold him over in the meantime.  We talked extensively for over an hour about Kristopher Snyder's bills and medication shortages. It sounds like he just got a notice from his pharmacy that says Ozempic is not available, Trulicity not available, we will try Mounjaro.

## 2023-01-15 NOTE — Assessment & Plan Note (Signed)
Hans had some diarrhea after a hospital discharge in February, unfortunately C. difficile diarrhea was noted with positive toxins. We added vancomycin for 10 days but he did not finish the course of antibiotics, still has some frequent stools, he does need another C. difficile test and if positive he will need another course of vancomycin, I emphasized the importance of finishing his of antibiotics when he gets them.

## 2023-01-16 LAB — C. DIFFICILE GDH AND TOXIN A/B
GDH ANTIGEN: NOT DETECTED
MICRO NUMBER:: 14964259
SPECIMEN QUALITY:: ADEQUATE
TOXIN A AND B: NOT DETECTED

## 2023-01-16 NOTE — Telephone Encounter (Signed)
Pt called. He wants to know if Dr T wants him to take all of his med he has been prescrbed for bacterial infection in gut?

## 2023-02-07 ENCOUNTER — Other Ambulatory Visit: Payer: Self-pay | Admitting: Sports Medicine

## 2023-02-07 ENCOUNTER — Telehealth: Payer: Self-pay | Admitting: Sports Medicine

## 2023-02-07 DIAGNOSIS — E119 Type 2 diabetes mellitus without complications: Secondary | ICD-10-CM

## 2023-02-07 MED ORDER — MOUNJARO 7.5 MG/0.5ML ~~LOC~~ SOAJ
7.5000 mg | SUBCUTANEOUS | 0 refills | Status: DC
Start: 2023-02-07 — End: 2023-04-10

## 2023-02-07 MED ORDER — MOUNJARO 2.5 MG/0.5ML ~~LOC~~ SOAJ
2.5000 mg | SUBCUTANEOUS | 0 refills | Status: DC
Start: 2023-02-07 — End: 2023-04-10

## 2023-02-07 MED ORDER — MOUNJARO 5 MG/0.5ML ~~LOC~~ SOAJ
5.0000 mg | SUBCUTANEOUS | 0 refills | Status: DC
Start: 2023-02-07 — End: 2023-04-10

## 2023-02-07 NOTE — Telephone Encounter (Signed)
Why cannot he take it?  Does he not tolerate it?  Is he just unable to find it?  If he does not tolerate it I can just do a lower dose.

## 2023-02-07 NOTE — Telephone Encounter (Signed)
I will call it in again but drop it down to 2.5 mg weekly for a month then 5 mg weekly for a month and then, 10 mg weekly.  We may have just started it too high of a dose.

## 2023-02-07 NOTE — Telephone Encounter (Signed)
Patient called he is unable to take [facility] 10mg  he said he stopped taking it and Truliciity  shortage please advise

## 2023-02-26 ENCOUNTER — Encounter: Payer: Self-pay | Admitting: Sports Medicine

## 2023-02-26 ENCOUNTER — Ambulatory Visit (INDEPENDENT_AMBULATORY_CARE_PROVIDER_SITE_OTHER): Payer: 59 | Admitting: Sports Medicine

## 2023-02-26 VITALS — BP 116/78 | HR 85 | Ht 73.0 in | Wt 289.0 lb

## 2023-02-26 DIAGNOSIS — Z7985 Long-term (current) use of injectable non-insulin antidiabetic drugs: Secondary | ICD-10-CM

## 2023-02-26 DIAGNOSIS — F321 Major depressive disorder, single episode, moderate: Secondary | ICD-10-CM | POA: Diagnosis not present

## 2023-02-26 DIAGNOSIS — E119 Type 2 diabetes mellitus without complications: Secondary | ICD-10-CM

## 2023-02-26 DIAGNOSIS — M51369 Other intervertebral disc degeneration, lumbar region without mention of lumbar back pain or lower extremity pain: Secondary | ICD-10-CM

## 2023-02-26 DIAGNOSIS — M5136 Other intervertebral disc degeneration, lumbar region: Secondary | ICD-10-CM

## 2023-02-26 NOTE — Assessment & Plan Note (Signed)
Kristopher Snyder had axial back pain last treated in 2017, he does have L5-S1 disc desiccation with protrusion and right-sided foraminal stenosis on his last MRI. We added a burst of prednisone at the last visit, he is not entirely sure if he took it, but he does tell me his back has improved, at this point he can live with it so we will leave this alone for now. Next steps would be physical therapy.

## 2023-02-26 NOTE — Assessment & Plan Note (Signed)
Unable to get Trulicity, will switch to Ozempic, unable to get Ozempic, ultimately he was able to get [facility], he was on a relatively high dose of Trulicity and Ozempic so we switched him to 10 mg of Mounjaro, he did not tolerate this, I dropped back down to 2-1/2 mg, unfortunately he picked up 2 and half milligrams but decided not to take it. I informed him today of the importance of taking the medications for his diabetes, he does feel tired, lack of motivation and I explained that once we give his cells the ability to utilize the glucose in his body he will likely feel more energetic. He will do 2.5 mg for a month, 5 mg for a month, 7.5 mg for a month, and then back to 10 mg. I like to see him back in 3 months. I do think Kristopher Snyder has a great deal of difficulty navigating the system and understanding his medications, I am going to get community care management involved.

## 2023-02-26 NOTE — Progress Notes (Signed)
    Procedures performed today:    None.  Independent interpretation of notes and tests performed by another provider:   None.  Brief History, Exam, Impression, and Recommendations:    Controlled type 2 diabetes mellitus without complication, without long-term current use of insulin (HCC) Unable to get Trulicity, will switch to Ozempic, unable to get Ozempic, ultimately he was able to get [facility], he was on a relatively high dose of Trulicity and Ozempic so we switched him to 10 mg of Mounjaro, he did not tolerate this, I dropped back down to 2-1/2 mg, unfortunately he picked up 2 and half milligrams but decided not to take it. I informed him today of the importance of taking the medications for his diabetes, he does feel tired, lack of motivation and I explained that once we give his cells the ability to utilize the glucose in his body he will likely feel more energetic. He will do 2.5 mg for a month, 5 mg for a month, 7.5 mg for a month, and then back to 10 mg. I like to see him back in 3 months. I do think Kristopher Snyder has a great deal of difficulty navigating the system and understanding his medications, I am going to get community care management involved.  Depression, major, single episode, moderate (HCC) Kristopher Snyder is continue to take Lexapro 10, though we bumped him up to 20 mg at the last visit, I do not think he understood what he was supposed to do. Mood is okay but could use some improvement. No suicidal or homicidal ideation. He will start 20 mg daily.   Lumbar degenerative disc disease Kristopher Snyder had axial back pain last treated in 2017, he does have L5-S1 disc desiccation with protrusion and right-sided foraminal stenosis on his last MRI. We added a burst of prednisone at the last visit, he is not entirely sure if he took it, but he does tell me his back has improved, at this point he can live with it so we will leave this alone for now. Next steps would be physical  therapy.    ____________________________________________ [name]. Benjamin Stain, M.D., ABFM., CAQSM., AME. Primary Care and Sports Medicine [facility]  Adjunct Professor of Family Medicine  [place] of [facility] [facility] [employer] of Medicine  Restaurant manager, fast food

## 2023-02-26 NOTE — Assessment & Plan Note (Signed)
Kristopher Snyder is continue to take Lexapro 10, though we bumped him up to 20 mg at the last visit, I do not think he understood what he was supposed to do. Mood is okay but could use some improvement. No suicidal or homicidal ideation. He will start 20 mg daily.

## 2023-03-03 ENCOUNTER — Telehealth: Payer: Self-pay | Admitting: *Deleted

## 2023-03-03 NOTE — Progress Notes (Signed)
  Care Coordination   Note   03/03/2023 Name: [name] MRN: [id number] DOB: Oct 13, 1970  Kristopher Snyder is a 52 y.o. year old male who sees Thekkekandam, [name], MD for primary care. I reached out to [employer] by phone today to offer care coordination services.  [name] was given information about Care Coordination services today including:   The Care Coordination services include support from the care team which includes your Nurse Coordinator, Clinical Social Worker, or Pharmacist.  The Care Coordination team is here to help remove barriers to the health concerns and goals most important to you. Care Coordination services are voluntary, and the patient may decline or stop services at any time by request to their care team member.   Care Coordination Consent Status: Patient agreed to services and verbal consent obtained.   Follow up plan:  Telephone appointment with care coordination team member scheduled for:  03/10/2023  Encounter Outcome:  Pt. Scheduled  Burman Nieves, CCMA Care Coordination Care Guide Direct Dial: [phone]

## 2023-03-10 ENCOUNTER — Ambulatory Visit: Payer: Self-pay | Admitting: Licensed Clinical Social Worker

## 2023-03-10 NOTE — Patient Instructions (Signed)
Social Work Visit Information  Thank you for taking time to visit with me today. Please don't hesitate to contact me if I can be of assistance to you.   Following are the goals we discussed today:   Goals Addressed             This Visit's Progress    COMPLETED: Connect with RN for diabetes education       Activities and task to complete in order to accomplish goals.   Call or go to Department of Social Services to apply for food benefits  Keep all upcoming appointment discussed today Continue with compliance of taking medication prescribed by Doctor I have scheduled a phone appointment with the RN Care Manager she will provide educational informational and assist you with managing your health needs         No follow up scheduled with social work at this time. Patient will call office if needed. Appointment schedules with RN care manager  Please call the care guide team at [phone] if you need to cancel or reschedule your appointment.   If you or anyone you know are experiencing a Mental Health or Behavioral Health Crisis or need someone to talk to, please call the Suicide and Crisis Lifeline: 988 call the [place] National Suicide Prevention Lifeline: (612)623-8977 or TTY: 401-311-7044 TTY 956-034-4365) to talk to a trained counselor call 1-800-273-TALK (toll free, 24 hour hotline) go to [facility] [address] [place] [phone])   The patient verbalized understanding of instructions, educational materials, and care plan provided today and DECLINED offer to receive copy of patient instructions, educational materials, and care plan.     Sammuel Hines, LCSW Social Work Care Coordination  [facility] [employer] [phone]

## 2023-03-10 NOTE — Patient Outreach (Signed)
  Care Coordination  Initial Visit Note   03/10/2023 Name: Kristopher Snyder MRN: [id number] DOB: 06-26-1971  Kristopher Snyder is a 52 y.o. year old male who sees Thekkekandam, [name], MD for primary care. I spoke with  [name] by phone today.  What matters to the patients health and wellness today?  Reports he is doing well   Patient is open and willing to speak to RN care manager in a few weeks about education in managing his diabetes.   Goals Addressed             This Visit's Progress    COMPLETED: Connect with RN for diabetes education       Activities and task to complete in order to accomplish goals.   Call or go to Department of Social Services to apply for food benefits  Keep all upcoming appointment discussed today Continue with compliance of taking medication prescribed by Doctor I have scheduled a phone appointment with the RN Care Manager she will provide educational informational and assist you with managing your health needs        SDOH assessments and interventions completed:  Yes  SDOH Interventions Today    Flowsheet Row Most Recent Value  SDOH Interventions   Food Insecurity Interventions Intervention Not Indicated  Housing Interventions Intervention Not Indicated  Transportation Interventions Intervention Not Indicated  Utilities Interventions Intervention Not Indicated       Care Coordination Interventions:  Yes, provided  Interventions Today    Flowsheet Row Most Recent Value  Chronic Disease   Chronic disease during today's visit Diabetes  General Interventions   General Interventions Discussed/Reviewed General Interventions Reviewed, Referral to Nurse  [explained care coordination program]  Education Interventions   Education Provided Provided Education  Pharmacy Interventions   Pharmacy Dicussed/Reviewed Pharmacy Topics Reviewed       Follow up plan: Referral made to RN care manager    Encounter Outcome:  Pt. Visit Completed   Sammuel Hines, LCSW Social Work Care Coordination  [facility] [employer] [phone]

## 2023-03-27 ENCOUNTER — Telehealth: Payer: Self-pay

## 2023-03-27 NOTE — Patient Outreach (Signed)
  Care Coordination   03/27/2023 Name: Price Lachapelle MRN: [id number] DOB: [birth year]   Care Coordination Outreach Attempts:  An unsuccessful telephone outreach was attempted for a scheduled appointment today.  Follow Up Plan:  Additional outreach attempts will be made to offer the patient care coordination information and services.   Encounter Outcome:  No Answer   Care Coordination Interventions:  No, not indicated    [name], RN, MSN, BSN, CCM [facility] Care Coordinator [phone]

## 2023-04-09 ENCOUNTER — Telehealth: Payer: Self-pay | Admitting: Sports Medicine

## 2023-04-09 DIAGNOSIS — E119 Type 2 diabetes mellitus without complications: Secondary | ICD-10-CM

## 2023-04-09 NOTE — Telephone Encounter (Signed)
Called in for Dulaglutide (TRULICITY)  1.5/3mg . States that there are no refills at pharmacy.  [facility] [address] [place]

## 2023-04-09 NOTE — Telephone Encounter (Signed)
Which one is it, 1.5mg  or 3mg ?  In addition we had switched him to [facility].  What is he actually taking now?

## 2023-04-10 MED ORDER — TRULICITY 1.5 MG/0.5ML ~~LOC~~ SOAJ
1.5000 mg | SUBCUTANEOUS | 11 refills | Status: DC
Start: 2023-04-10 — End: 2023-05-09

## 2023-04-10 NOTE — Telephone Encounter (Signed)
Refilling Trulicity 1.5.

## 2023-04-10 NOTE — Telephone Encounter (Signed)
Left message advising of refill.  

## 2023-04-21 ENCOUNTER — Telehealth: Payer: Self-pay | Admitting: Sports Medicine

## 2023-04-21 NOTE — Telephone Encounter (Signed)
The alternative would be to go up on the dose to 3 mg

## 2023-04-21 NOTE — Telephone Encounter (Signed)
Patient called in stating that the Dulaglutide (TRULICITY) 1.5 MG/0.5ML SOPN  was not in stock at current pharmacy. States the medication that come in the blue injector hurts. Please advise for other alternatives.

## 2023-04-22 NOTE — Telephone Encounter (Signed)
Patient notified of results.

## 2023-04-29 ENCOUNTER — Ambulatory Visit: Payer: Self-pay

## 2023-04-29 NOTE — Patient Outreach (Signed)
  Care Coordination   Initial Visit Note   04/29/2023 Name: Kristopher Snyder MRN: [id number] DOB: 17-Oct-1970  Kristopher Snyder is a 52 y.o. year old male who sees Thekkekandam, [name], MD for primary care. I spoke with  Kristopher Snyder by phone today.  What matters to the patients health and wellness today?  Expressed frustration with difficulty getting medications. Declines social work referral to assist with expressed anxiety and frustration regarding difficulty obtaining medications. However receptive to pharmacy referral to review medications to make sure he is able to obtain prescribed medications moving forward.   Goals Addressed             This Visit's Progress    Care Coordination activities       Interventions Today    Flowsheet Row Most Recent Value  Chronic Disease   Chronic disease during today's visit Diabetes  General Interventions   General Interventions Discussed/Reviewed General Interventions Discussed, Doctor Visits  Doctor Visits Discussed/Reviewed Doctor Visits Discussed, PCP  PCP/Specialist Visits Compliance with follow-up visit  [upcoming appointments reviewed]  Mental Health Interventions   Mental Health Discussed/Reviewed Other, Coping Strategies, Anxiety  [offered referral to social work re: anxiety, frustration, coping strategies. patient declined. advised to take medications as prescribed, attend provider visits as prescribed, contact provider with health questions or concerns]  Pharmacy Interventions   Pharmacy Dicussed/Reviewed Pharmacy Topics Discussed, Referral to Pharmacist  [difficulty obtaining medication Trullicity. review medications 1.5mg  on profile. patient reports picked up 3mg .]            SDOH assessments and interventions completed:  No  Care Coordination Interventions:  Yes, provided   Follow up plan: Follow up call scheduled for 05/20/23    Encounter Outcome:  Pt. Visit Completed   [name], RN, MSN, BSN, CCM Care Management  Coordinator [phone]

## 2023-04-29 NOTE — Patient Instructions (Addendum)
Visit Information  Thank you for taking time to visit with me today. Please don't hesitate to contact me if I can be of assistance to you.   Following are the goals we discussed today:   Goals Addressed             This Visit's Progress    Care Coordination activities       Interventions Today    Flowsheet Row Most Recent Value  Chronic Disease   Chronic disease during today's visit Diabetes  General Interventions   General Interventions Discussed/Reviewed General Interventions Discussed, Doctor Visits  Doctor Visits Discussed/Reviewed Doctor Visits Discussed, PCP  PCP/Specialist Visits Compliance with follow-up visit  [upcoming appointments reviewed]  Mental Health Interventions   Mental Health Discussed/Reviewed Other, Coping Strategies, Anxiety  [offered referral to social work re: anxiety, frustration, coping strategies. patient declined. advised to take medications as prescribed, attend provider visits as prescribed, contact provider with health questions or concerns]  Pharmacy Interventions   Pharmacy Dicussed/Reviewed Pharmacy Topics Discussed, Referral to Pharmacist  [difficulty obtaining medication Trullicity. review medications 1.5mg  on profile. patient reports picked up 3mg .]            Our next appointment is by telephone on 05/20/23 at 10:00 am  Please call the care guide team at [phone] if you need to cancel or reschedule your appointment.   If you are experiencing a Mental Health or Behavioral Health Crisis or need someone to talk to, please call the Suicide and Crisis Lifeline: 988 call the [place] National Suicide Prevention Lifeline: 940 538 7005 or TTY: 781-728-8486 TTY 671-636-6402) to talk to a trained counselor call 1-800-273-TALK (toll free, 24 hour hotline)  [name], RN, MSN, BSN, CCM Care Management Coordinator [phone]

## 2023-05-01 ENCOUNTER — Telehealth: Payer: Self-pay

## 2023-05-01 NOTE — Progress Notes (Signed)
   Care Guide Note  05/01/2023 Name: Kristopher Snyder MRN: [id number] DOB: [birth year]  Referred by: Monica Becton, MD Reason for referral : Care Coordination (Outreach to schedule with Pharm d )   Kristopher Snyder is a 52 y.o. year old male who is a primary care patient of Benjamin Stain, [name], MD. Kristopher Snyder was referred to the pharmacist for assistance related to HLD and DM.    An unsuccessful telephone outreach was attempted today to contact the patient who was referred to the pharmacy team for assistance with medication management. Additional attempts will be made to contact the patient.   Penne Lash, RMA Care Guide [facility]  [place], [place] [address] Direct Dial: [phone] [email or web address] '

## 2023-05-06 NOTE — Progress Notes (Signed)
   Care Guide Note  05/06/2023 Name: Kristopher Snyder MRN: [id number] DOB: Jan 10, 1971  Referred by: Monica Becton, MD Reason for referral : Care Coordination (Outreach to schedule with Pharm d )   Kristopher Snyder is a 52 y.o. year old male who is a primary care patient of Benjamin Stain, [name], MD. Kristopher Snyder was referred to the pharmacist for assistance related to DM.    Successful contact was made with the patient to discuss pharmacy services including being ready for the pharmacist to call at least 5 minutes before the scheduled appointment time, to have medication bottles and any blood sugar or blood pressure readings ready for review. The patient agreed to meet with the pharmacist via with the pharmacist via telephone visit on (date/time).  05/09/2023  Penne Lash, RMA Care Guide [facility]  [place], [place] [address] Direct Dial: [phone] [email or web address]

## 2023-05-06 NOTE — Progress Notes (Signed)
   Care Guide Note  05/06/2023 Name: [name] MRN: [id number] DOB: 1970/09/22  Referred by: Monica Becton, MD Reason for referral : Care Coordination (Outreach to schedule with Pharm d )   Kristopher Snyder is a 52 y.o. year old male who is a primary care patient of Benjamin Stain, [name], MD. Kristopher Snyder was referred to the pharmacist for assistance related to DM.    A second unsuccessful telephone outreach was attempted today to contact the patient who was referred to the pharmacy team for assistance with medication management. Additional attempts will be made to contact the patient.  Penne Lash, RMA Care Guide [facility]  [place], [place] [address] Direct Dial: [phone] [email or web address]

## 2023-05-09 ENCOUNTER — Other Ambulatory Visit (HOSPITAL_COMMUNITY): Payer: Self-pay

## 2023-05-09 ENCOUNTER — Other Ambulatory Visit: Payer: 59

## 2023-05-09 MED ORDER — TRULICITY 3 MG/0.5ML ~~LOC~~ SOAJ
3.0000 mg | SUBCUTANEOUS | 9 refills | Status: DC
  Filled 2023-05-09 – 2023-05-17 (×3): qty 2, 28d supply, fill #0

## 2023-05-09 NOTE — Progress Notes (Signed)
05/09/2023 Name: Kristopher Snyder MRN: [id number] DOB: 12/13/1970  Chief Complaint  Patient presents with   Medication Management   Kristopher Snyder is a 52 y.o. year old male who presented for a telephone visit.   They were referred to the pharmacist by their PCP for assistance in managing medication access.   Subjective:  Care Team: Primary Care Provider: Monica Becton, MD ; Next Scheduled Visit: 9/26  Medication Access/Adherence  [phone] [place] [place] - [address] [place] [address] Phone: [phone] Fax: [phone]  -Patient reports affordability concerns with their medications: No  -Patient reports access/transportation concerns to their pharmacy: No  -Patient reports adherence concerns with their medications:  Yes  Pharmacy does not always have Trulicity 3mg  in stock, so patient has not been able to take consistently -Patient endorses good adherence to all other medications -Patient would like to get established with [facility] for home delivery of medications  Diabetes: Current medications: Trulicity 3mg  weekly  -Patient reports not being able to consistently get Trulicity 3mg  [facility] -He currently has 2 pens remaining at home, and he does not wish to increase dose at this time -Patient does not check home BG  Objective: Lab Results  Component Value Date   HGBA1C 6.1 (H) 09/24/2022   Lab Results  Component Value Date   CREATININE 0.65 (L) 09/24/2022   BUN 11 09/24/2022   NA 140 09/24/2022   K 4.8 09/24/2022   CL 107 09/24/2022   CO2 23 09/24/2022   Medications Reviewed Today     Reviewed by Lenna Gilford, RPH (Pharmacist) on 05/09/23 at [phone]  Med List Status: <None>   Medication Order Taking? Sig Documenting Provider Last Dose Status Informant  alprazolam (XANAX) 2 MG tablet [id number] Yes Take 1 tablet (2 mg total) by mouth 3 (three) times daily. Monica Becton, MD Taking Active   atorvastatin (LIPITOR) 10 MG tablet [id number] Yes Take 1 tablet (10 mg total) by mouth daily. Monica Becton, MD Taking Active   Dulaglutide (TRULICITY) 3 MG/0.5ML Namon Cirri [id number] Yes Inject 3 mg into the skin once a week. [provider] Taking Active   escitalopram (LEXAPRO) 20 MG tablet [id number] Yes Take 1 tablet (20 mg total) by mouth daily. Monica Becton, MD Taking Active   ibuprofen (ADVIL) 800 MG tablet [id number] Yes TAKE 1 TABLET BY MOUTH 3 TIMES DAILY AS NEEDED. Monica Becton, MD Taking Active    Patient not taking:   Discontinued 05/09/23 0841 (Completed Course)            Assessment/Plan:   Medication Access/Adherence -Coordinating with [facility] to [facility]; will work with providers to send new scripts if no refills remain on any active medications -[facility] has historically been more consistent with Trulicity inventory, but I provided patient with my direct phone number for any medication issues that may arise -Patient has been provided with [facility] phone number to request refills when needed; also informed him he will need to provide payment information to cover any copays for medications (delivery is no additional charge) -Verified home address on file is correct for home delivery   Diabetes: - Currently controlled - Continue current regimen at this time - Recommend follow-up A1c at upcoming PCP visit; if >7%, I recommend increasing Trulicity to 4.5mg  weekly  Follow Up Plan: None scheduled at this time but can as recommended per PCP  Elnita Maxwell A  Littie Deeds, PharmD, DPLA

## 2023-05-10 ENCOUNTER — Other Ambulatory Visit (HOSPITAL_COMMUNITY): Payer: Self-pay

## 2023-05-10 MED ORDER — TRULICITY 1.5 MG/0.5ML ~~LOC~~ SOAJ
1.5000 mg | SUBCUTANEOUS | 11 refills | Status: DC
  Filled 2023-05-10 – 2023-05-12 (×2): qty 2, 28d supply, fill #0

## 2023-05-12 ENCOUNTER — Other Ambulatory Visit (HOSPITAL_COMMUNITY): Payer: Self-pay

## 2023-05-12 ENCOUNTER — Telehealth: Payer: Self-pay | Admitting: Sports Medicine

## 2023-05-12 ENCOUNTER — Other Ambulatory Visit: Payer: Self-pay

## 2023-05-12 NOTE — Telephone Encounter (Signed)
Pt called. Pt states you sent him to a  new pharmacy but he is unhappy with them.   He states he tried to get his script for his Alprazolam filled and he was told his script has expired but states that  he still has refills.  Please advise.

## 2023-05-13 ENCOUNTER — Other Ambulatory Visit (HOSPITAL_COMMUNITY): Payer: Self-pay

## 2023-05-13 ENCOUNTER — Telehealth: Payer: Self-pay

## 2023-05-13 MED ORDER — ALPRAZOLAM 2 MG PO TABS
2.0000 mg | ORAL_TABLET | Freq: Three times a day (TID) | ORAL | 1 refills | Status: DC
  Filled 2023-05-13 – 2023-09-09 (×2): qty 90, 30d supply, fill #0
  Filled 2023-11-03: qty 90, 30d supply, fill #1
  Filled ????-??-??: fill #1

## 2023-05-13 NOTE — Telephone Encounter (Signed)
Copied from CRM [id number]. Topic: General - Other >> May 13, 2023  2:24 PM Franchot Heidelberg wrote: Reason for CRM: Pt called requesting to speak to [facility] the pharmacist, please advise  Best contact: [phone]

## 2023-05-13 NOTE — Progress Notes (Signed)
   05/13/2023  Patient ID: [name], male   DOB: 14-May-1971, 52 y.o.   MRN: [id number]  Clinic routed request to assist patient with obtaining a refill of his alprazolam.  He was recently set up with [facility] for processing and home delivery of medications, but he has not been able to get his alprazolam refilled; because he was told the prescription has expired.  I contacted [facility], and the only prescription they have on file was written by Dr. Karie Schwalbe >6 months ago, so it has expired.  [facility] for alprazolam written by Dr. Sandria Manly on 12/05/22 to [facility] Friday 9/6.  Contacted [facility] again to see if this transfer was received, and they were able locate the prescription.  This is being processed and will be delivered to the patient tomorrow; he will need to be home to sign for the delivery.  Patient was also expecting Trulicity 3mg , but he received 1.5mg  today.  Pharmacy states they did not receive the transfer for the 3mg  dose [facility] until the 1.5mg  had already been processed; so insurance would not allow the 3mg  dose to process.  They can fill Trulicity 3mg  weekly next Monday and deliver to patient Tuesday.  Patient aware of all above information.  Lenna Gilford, PharmD, DPLA

## 2023-05-14 ENCOUNTER — Other Ambulatory Visit (HOSPITAL_COMMUNITY): Payer: Self-pay

## 2023-05-14 NOTE — Telephone Encounter (Signed)
Pt is calling back in regards to his medication, I did advise to the pt his medication will be delivered to him tomorrow and that he will need to be home to sign for the medication. Pt states to please let the delivery company know that he has two [place] dogs and they will not bother the delivery driver.

## 2023-05-17 ENCOUNTER — Other Ambulatory Visit (HOSPITAL_COMMUNITY): Payer: Self-pay

## 2023-05-19 ENCOUNTER — Telehealth: Payer: Self-pay

## 2023-05-19 ENCOUNTER — Other Ambulatory Visit: Payer: Self-pay

## 2023-05-19 ENCOUNTER — Other Ambulatory Visit (HOSPITAL_COMMUNITY): Payer: Self-pay

## 2023-05-19 NOTE — Progress Notes (Signed)
05/19/2023  Patient ID: Kristopher Snyder, male   DOB: 1970/10/07, 52 y.o.   MRN: [id number]  Patient outreach to verify Mr. Dishner received his alprazolam prescription from [facility].  He states this was delivered to his home along with escitalopram.  Pharmacy now has all active medications on profile, so I made him aware to call them to request refills when needed.  Lenna Gilford, PharmD, DPLA

## 2023-05-20 ENCOUNTER — Ambulatory Visit: Payer: Self-pay

## 2023-05-20 NOTE — Patient Instructions (Signed)
Visit Information  Thank you for taking time to visit with me today. Please don't hesitate to contact me if I can be of assistance to you.   Following are the goals we discussed today:  Continue to take medications as prescribed Continue to attend provider appointments as scheduled/recommended Contact your provider with any health questions or concerns as needed   If you are experiencing a Mental Health or Behavioral Health Crisis or need someone to talk to, please call the Suicide and Crisis Lifeline: 988 call the [place] National Suicide Prevention Lifeline: 8132724867 or TTY: 910 563 2535 TTY 903-650-3022) to talk to a trained counselor call 1-800-273-TALK (toll free, 24 hour hotline)  [name], RN, MSN, BSN, CCM Care Management Coordinator [phone]

## 2023-05-20 NOTE — Patient Outreach (Signed)
Care Coordination   Follow Up Visit Note   05/20/2023 Name: Kristopher Snyder MRN: [id number] DOB: Apr 10, 1971  Kristopher Snyder is a 52 y.o. year old male who sees Thekkekandam, [name], MD for primary care. I spoke with  [name] by phone today.  What matters to the patients health and wellness today? Kristopher Snyder states he has all his medications now. He declines any educational material to assist with disease management. He denies any questions, concerns or care management needs. No care management needs identified at this time.  Goals Addressed             This Visit's Progress    COMPLETED: Care Coordination activities       Interventions Today    Flowsheet Row Most Recent Value  Chronic Disease   Chronic disease during today's visit Diabetes  General Interventions   General Interventions Discussed/Reviewed General Interventions Reviewed  [assessed for additonal care management needs. confirmed patient has RNCM contact number and encouraged to contact RNCM if care management needs in the future.]  Education Interventions   Education Provided Provided Education  Provided Verbal Education On Medication, When to see the doctor  [advised to continue to take medications as prescribed,  attend appointments as recommended/scheduled,  contact provider with any health questions or concerns.]            SDOH assessments and interventions completed:  No  Care Coordination Interventions:  Yes, provided   Follow up plan: No further intervention required.   Encounter Outcome:  Patient Visit Completed   [name], RN, MSN, BSN, CCM Care Management Coordinator [phone]

## 2023-05-29 ENCOUNTER — Ambulatory Visit: Payer: 59 | Admitting: Sports Medicine

## 2023-05-29 ENCOUNTER — Other Ambulatory Visit (HOSPITAL_COMMUNITY): Payer: Self-pay

## 2023-05-29 DIAGNOSIS — F411 Generalized anxiety disorder: Secondary | ICD-10-CM | POA: Diagnosis not present

## 2023-05-29 MED ORDER — ALPRAZOLAM 2 MG PO TABS
2.0000 mg | ORAL_TABLET | Freq: Three times a day (TID) | ORAL | 5 refills | Status: DC
  Filled 2023-06-19: qty 90, 30d supply, fill #0
  Filled 2023-08-01: qty 90, 30d supply, fill #1
  Filled 2023-10-07: qty 90, 30d supply, fill #2
  Filled ????-??-??: fill #2

## 2023-06-19 ENCOUNTER — Other Ambulatory Visit (HOSPITAL_COMMUNITY): Payer: Self-pay

## 2023-07-02 ENCOUNTER — Other Ambulatory Visit (HOSPITAL_COMMUNITY): Payer: Self-pay

## 2023-07-02 ENCOUNTER — Other Ambulatory Visit: Payer: Self-pay | Admitting: Sports Medicine

## 2023-07-03 ENCOUNTER — Other Ambulatory Visit (HOSPITAL_BASED_OUTPATIENT_CLINIC_OR_DEPARTMENT_OTHER): Payer: Self-pay

## 2023-07-03 ENCOUNTER — Other Ambulatory Visit (HOSPITAL_COMMUNITY): Payer: Self-pay

## 2023-07-03 ENCOUNTER — Other Ambulatory Visit: Payer: Self-pay

## 2023-07-03 MED FILL — Dulaglutide Soln Auto-injector 3 MG/0.5ML: SUBCUTANEOUS | 28 days supply | Qty: 2 | Fill #0 | Status: AC

## 2023-07-22 ENCOUNTER — Other Ambulatory Visit (HOSPITAL_COMMUNITY): Payer: Self-pay

## 2023-08-01 ENCOUNTER — Other Ambulatory Visit: Payer: Self-pay

## 2023-08-01 ENCOUNTER — Other Ambulatory Visit (HOSPITAL_COMMUNITY): Payer: Self-pay

## 2023-08-01 MED FILL — Dulaglutide Soln Auto-injector 3 MG/0.5ML: SUBCUTANEOUS | 28 days supply | Qty: 2 | Fill #1 | Status: AC

## 2023-08-05 ENCOUNTER — Other Ambulatory Visit (HOSPITAL_COMMUNITY): Payer: Self-pay

## 2023-09-09 ENCOUNTER — Other Ambulatory Visit (HOSPITAL_COMMUNITY): Payer: Self-pay

## 2023-09-09 ENCOUNTER — Other Ambulatory Visit: Payer: Self-pay

## 2023-09-09 MED FILL — Dulaglutide Soln Auto-injector 3 MG/0.5ML: SUBCUTANEOUS | 28 days supply | Qty: 2 | Fill #2 | Status: AC

## 2023-10-03 ENCOUNTER — Other Ambulatory Visit: Payer: Self-pay | Admitting: Sports Medicine

## 2023-10-03 ENCOUNTER — Other Ambulatory Visit (HOSPITAL_COMMUNITY): Payer: Self-pay

## 2023-10-03 DIAGNOSIS — E785 Hyperlipidemia, unspecified: Secondary | ICD-10-CM

## 2023-10-03 DIAGNOSIS — E119 Type 2 diabetes mellitus without complications: Secondary | ICD-10-CM

## 2023-10-06 MED FILL — Dulaglutide Soln Auto-injector 3 MG/0.5ML: SUBCUTANEOUS | 28 days supply | Qty: 2 | Fill #3 | Status: AC

## 2023-10-07 ENCOUNTER — Other Ambulatory Visit: Payer: Self-pay

## 2023-10-27 ENCOUNTER — Other Ambulatory Visit (HOSPITAL_COMMUNITY): Payer: Self-pay

## 2023-10-28 MED FILL — Dulaglutide Soln Auto-injector 3 MG/0.5ML: SUBCUTANEOUS | 28 days supply | Qty: 2 | Fill #4 | Status: AC

## 2023-11-03 ENCOUNTER — Other Ambulatory Visit: Payer: Self-pay

## 2023-11-03 ENCOUNTER — Other Ambulatory Visit (HOSPITAL_COMMUNITY): Payer: Self-pay

## 2023-11-04 ENCOUNTER — Other Ambulatory Visit (HOSPITAL_COMMUNITY): Payer: Self-pay

## 2023-11-11 ENCOUNTER — Other Ambulatory Visit (HOSPITAL_COMMUNITY): Payer: Self-pay

## 2023-11-25 MED FILL — Dulaglutide Soln Auto-injector 3 MG/0.5ML: SUBCUTANEOUS | 28 days supply | Qty: 2 | Fill #5 | Status: AC

## 2023-11-26 ENCOUNTER — Other Ambulatory Visit (HOSPITAL_COMMUNITY): Payer: Self-pay

## 2023-11-26 DIAGNOSIS — F411 Generalized anxiety disorder: Secondary | ICD-10-CM | POA: Diagnosis not present

## 2023-11-26 MED ORDER — ALPRAZOLAM 2 MG PO TABS
2.0000 mg | ORAL_TABLET | Freq: Three times a day (TID) | ORAL | 5 refills | Status: DC
Start: 2023-11-26 — End: 2024-05-24
  Filled 2023-12-02: qty 90, 30d supply, fill #0
  Filled 2024-01-05: qty 90, 30d supply, fill #1
  Filled 2024-02-11: qty 90, 30d supply, fill #2
  Filled 2024-03-12: qty 90, 30d supply, fill #3
  Filled 2024-04-15: qty 90, 30d supply, fill #4
  Filled 2024-05-14: qty 90, 30d supply, fill #5

## 2023-12-02 ENCOUNTER — Other Ambulatory Visit: Payer: Self-pay

## 2023-12-03 ENCOUNTER — Other Ambulatory Visit (HOSPITAL_COMMUNITY): Payer: Self-pay

## 2023-12-09 ENCOUNTER — Other Ambulatory Visit (HOSPITAL_COMMUNITY): Payer: Self-pay

## 2023-12-09 MED FILL — Dulaglutide Soln Auto-injector 3 MG/0.5ML: SUBCUTANEOUS | 28 days supply | Qty: 2 | Fill #6 | Status: CN

## 2023-12-23 ENCOUNTER — Other Ambulatory Visit (HOSPITAL_COMMUNITY): Payer: Self-pay

## 2023-12-23 MED FILL — Dulaglutide Soln Auto-injector 3 MG/0.5ML: SUBCUTANEOUS | 28 days supply | Qty: 2 | Fill #6 | Status: CN

## 2024-01-01 ENCOUNTER — Other Ambulatory Visit (HOSPITAL_COMMUNITY): Payer: Self-pay

## 2024-01-05 ENCOUNTER — Other Ambulatory Visit (HOSPITAL_COMMUNITY): Payer: Self-pay

## 2024-01-06 ENCOUNTER — Other Ambulatory Visit (HOSPITAL_COMMUNITY): Payer: Self-pay

## 2024-01-07 ENCOUNTER — Other Ambulatory Visit (HOSPITAL_COMMUNITY): Payer: Self-pay

## 2024-01-07 ENCOUNTER — Other Ambulatory Visit: Payer: Self-pay

## 2024-01-08 ENCOUNTER — Other Ambulatory Visit (HOSPITAL_COMMUNITY): Payer: Self-pay

## 2024-01-08 ENCOUNTER — Telehealth: Payer: Self-pay

## 2024-01-08 MED FILL — Dulaglutide Soln Auto-injector 3 MG/0.5ML: SUBCUTANEOUS | 28 days supply | Qty: 2 | Fill #6 | Status: CN

## 2024-01-08 NOTE — Telephone Encounter (Signed)
 Pharmacy Patient Advocate Encounter   Received notification from CoverMyMeds that prior authorization for Trulicity  3MG /0.5ML auto-injectors is required/requested.   Insurance verification completed.   The patient is insured through [facility] .   Per test claim: PA required and submitted KEY/EOC/Request #: [id number] from 01/08/24 to 01/07/25. Ran test claim, Copay is $20. This test claim was processed through [facility]- copay amounts may vary at other pharmacies due to pharmacy/plan contracts, or as the patient moves through the different stages of their insurance plan.

## 2024-01-09 MED FILL — Dulaglutide Soln Auto-injector 3 MG/0.5ML: SUBCUTANEOUS | 28 days supply | Qty: 2 | Fill #6 | Status: AC

## 2024-01-26 ENCOUNTER — Other Ambulatory Visit: Payer: Self-pay | Admitting: Sports Medicine

## 2024-01-26 DIAGNOSIS — F419 Anxiety disorder, unspecified: Secondary | ICD-10-CM

## 2024-01-26 DIAGNOSIS — F321 Major depressive disorder, single episode, moderate: Secondary | ICD-10-CM

## 2024-01-27 ENCOUNTER — Other Ambulatory Visit (HOSPITAL_COMMUNITY): Payer: Self-pay

## 2024-01-27 ENCOUNTER — Other Ambulatory Visit: Payer: Self-pay

## 2024-01-27 MED ORDER — ESCITALOPRAM OXALATE 20 MG PO TABS
20.0000 mg | ORAL_TABLET | Freq: Every day | ORAL | 1 refills | Status: DC
  Filled 2024-01-27: qty 30, 30d supply, fill #0
  Filled 2024-02-23: qty 30, 30d supply, fill #1

## 2024-02-02 MED FILL — Dulaglutide Soln Auto-injector 3 MG/0.5ML: SUBCUTANEOUS | 28 days supply | Qty: 2 | Fill #7 | Status: AC

## 2024-02-11 ENCOUNTER — Other Ambulatory Visit: Payer: Self-pay

## 2024-02-11 ENCOUNTER — Other Ambulatory Visit (HOSPITAL_COMMUNITY): Payer: Self-pay

## 2024-03-01 MED FILL — Dulaglutide Soln Auto-injector 3 MG/0.5ML: SUBCUTANEOUS | 28 days supply | Qty: 2 | Fill #8 | Status: AC

## 2024-03-12 ENCOUNTER — Other Ambulatory Visit: Payer: Self-pay

## 2024-03-12 ENCOUNTER — Other Ambulatory Visit (HOSPITAL_COMMUNITY): Payer: Self-pay

## 2024-03-12 ENCOUNTER — Other Ambulatory Visit: Payer: Self-pay | Admitting: Sports Medicine

## 2024-03-12 DIAGNOSIS — E785 Hyperlipidemia, unspecified: Secondary | ICD-10-CM

## 2024-03-12 DIAGNOSIS — E119 Type 2 diabetes mellitus without complications: Secondary | ICD-10-CM

## 2024-03-15 ENCOUNTER — Other Ambulatory Visit: Payer: Self-pay

## 2024-03-15 ENCOUNTER — Other Ambulatory Visit (HOSPITAL_COMMUNITY): Payer: Self-pay

## 2024-03-15 ENCOUNTER — Telehealth: Payer: Self-pay

## 2024-03-15 MED ORDER — ATORVASTATIN CALCIUM 10 MG PO TABS
10.0000 mg | ORAL_TABLET | Freq: Every day | ORAL | 0 refills | Status: AC
  Filled 2024-03-15: qty 30, 30d supply, fill #0
  Filled 2024-04-08: qty 30, 30d supply, fill #1
  Filled 2024-05-05: qty 30, 30d supply, fill #2

## 2024-03-15 NOTE — Telephone Encounter (Signed)
 Patient has been contacted and advised an appointment is needed for further refills on any medications. Today 03/15/2024 we have refilled a 90-day supply of Atorvastatin  until he is able to schedule a annual exam with Dr. Curtis. Pt has been advised further refills will be decreasing in quantity until appointment is scheduled.

## 2024-03-22 ENCOUNTER — Other Ambulatory Visit (HOSPITAL_COMMUNITY): Payer: Self-pay

## 2024-03-22 ENCOUNTER — Other Ambulatory Visit: Payer: Self-pay | Admitting: Sports Medicine

## 2024-03-22 DIAGNOSIS — F321 Major depressive disorder, single episode, moderate: Secondary | ICD-10-CM

## 2024-03-22 DIAGNOSIS — F419 Anxiety disorder, unspecified: Secondary | ICD-10-CM

## 2024-03-22 MED ORDER — ESCITALOPRAM OXALATE 20 MG PO TABS
20.0000 mg | ORAL_TABLET | Freq: Every day | ORAL | 0 refills | Status: DC
  Filled 2024-03-22: qty 30, 30d supply, fill #0

## 2024-03-22 NOTE — Telephone Encounter (Signed)
 Patient scheduled on 7/29 for physical, thanks.

## 2024-03-29 MED FILL — Dulaglutide Soln Auto-injector 3 MG/0.5ML: SUBCUTANEOUS | 28 days supply | Qty: 2 | Fill #9 | Status: AC

## 2024-03-30 ENCOUNTER — Encounter: Admitting: Sports Medicine

## 2024-04-15 ENCOUNTER — Other Ambulatory Visit: Payer: Self-pay

## 2024-04-15 ENCOUNTER — Other Ambulatory Visit (HOSPITAL_COMMUNITY): Payer: Self-pay

## 2024-04-20 ENCOUNTER — Other Ambulatory Visit: Payer: Self-pay | Admitting: Sports Medicine

## 2024-04-20 DIAGNOSIS — F321 Major depressive disorder, single episode, moderate: Secondary | ICD-10-CM

## 2024-04-20 DIAGNOSIS — F419 Anxiety disorder, unspecified: Secondary | ICD-10-CM

## 2024-04-21 ENCOUNTER — Other Ambulatory Visit (HOSPITAL_COMMUNITY): Payer: Self-pay

## 2024-04-21 ENCOUNTER — Other Ambulatory Visit: Payer: Self-pay

## 2024-04-21 MED ORDER — ESCITALOPRAM OXALATE 20 MG PO TABS
20.0000 mg | ORAL_TABLET | Freq: Every day | ORAL | 0 refills | Status: DC
  Filled 2024-04-21: qty 15, 15d supply, fill #0

## 2024-04-30 ENCOUNTER — Encounter: Admitting: Sports Medicine

## 2024-04-30 ENCOUNTER — Encounter: Payer: Self-pay | Admitting: Urgent Care

## 2024-04-30 ENCOUNTER — Ambulatory Visit (INDEPENDENT_AMBULATORY_CARE_PROVIDER_SITE_OTHER): Admitting: Urgent Care

## 2024-04-30 ENCOUNTER — Other Ambulatory Visit (HOSPITAL_COMMUNITY): Payer: Self-pay

## 2024-04-30 ENCOUNTER — Other Ambulatory Visit: Payer: Self-pay

## 2024-04-30 VITALS — BP 142/101 | HR 86 | Resp 18 | Ht 73.0 in | Wt 274.0 lb

## 2024-04-30 DIAGNOSIS — G4709 Other insomnia: Secondary | ICD-10-CM

## 2024-04-30 DIAGNOSIS — Z604 Social exclusion and rejection: Secondary | ICD-10-CM | POA: Diagnosis not present

## 2024-04-30 DIAGNOSIS — E119 Type 2 diabetes mellitus without complications: Secondary | ICD-10-CM

## 2024-04-30 DIAGNOSIS — E785 Hyperlipidemia, unspecified: Secondary | ICD-10-CM | POA: Diagnosis not present

## 2024-04-30 DIAGNOSIS — Z0000 Encounter for general adult medical examination without abnormal findings: Secondary | ICD-10-CM | POA: Diagnosis not present

## 2024-04-30 DIAGNOSIS — M5136 Other intervertebral disc degeneration, lumbar region with discogenic back pain only: Secondary | ICD-10-CM | POA: Diagnosis not present

## 2024-04-30 DIAGNOSIS — K7030 Alcoholic cirrhosis of liver without ascites: Secondary | ICD-10-CM

## 2024-04-30 DIAGNOSIS — Z125 Encounter for screening for malignant neoplasm of prostate: Secondary | ICD-10-CM

## 2024-04-30 DIAGNOSIS — F332 Major depressive disorder, recurrent severe without psychotic features: Secondary | ICD-10-CM | POA: Diagnosis not present

## 2024-04-30 DIAGNOSIS — Z789 Other specified health status: Secondary | ICD-10-CM

## 2024-04-30 MED ORDER — AMITRIPTYLINE HCL 50 MG PO TABS
50.0000 mg | ORAL_TABLET | Freq: Every day | ORAL | 1 refills | Status: AC
  Filled 2024-04-30: qty 30, 30d supply, fill #0
  Filled 2024-05-23 – 2024-05-25 (×2): qty 30, 30d supply, fill #1
  Filled 2024-06-22: qty 30, 30d supply, fill #2
  Filled 2024-07-22: qty 30, 30d supply, fill #3
  Filled 2024-08-16: qty 30, 30d supply, fill #4
  Filled 2024-09-13: qty 30, 30d supply, fill #5

## 2024-04-30 MED ORDER — CELECOXIB 200 MG PO CAPS
200.0000 mg | ORAL_CAPSULE | Freq: Two times a day (BID) | ORAL | 2 refills | Status: AC
  Filled 2024-04-30: qty 60, 30d supply, fill #0
  Filled 2024-05-23 – 2024-05-25 (×2): qty 60, 30d supply, fill #1
  Filled 2024-06-22: qty 60, 30d supply, fill #2

## 2024-04-30 NOTE — Patient Instructions (Addendum)
 I have placed a referral for you to [facility]. You can reach out to them directly at [phone]. This is a Industrial/product designer to help with your depression.  Please start taking Elavil  nightly. This should help with your sleep and depression.  Please start taking celebrex  twice daily to help with your pain level.   Follow up in 4 weeks.

## 2024-05-02 ENCOUNTER — Ambulatory Visit: Payer: Self-pay | Admitting: Urgent Care

## 2024-05-02 ENCOUNTER — Encounter: Payer: Self-pay | Admitting: Urgent Care

## 2024-05-02 ENCOUNTER — Other Ambulatory Visit (HOSPITAL_COMMUNITY): Payer: Self-pay

## 2024-05-02 MED ORDER — EMPAGLIFLOZIN 25 MG PO TABS
25.0000 mg | ORAL_TABLET | Freq: Every day | ORAL | 1 refills | Status: AC
  Filled 2024-05-02 – 2024-05-07 (×3): qty 90, 90d supply, fill #0
  Filled 2024-05-08: qty 30, 30d supply, fill #0
  Filled 2024-06-02: qty 30, 30d supply, fill #1
  Filled 2024-07-02: qty 30, 30d supply, fill #2
  Filled 2024-08-01: qty 30, 30d supply, fill #3
  Filled 2024-09-04: qty 30, 30d supply, fill #4
  Filled 2024-10-04: qty 30, 30d supply, fill #5

## 2024-05-02 NOTE — Progress Notes (Signed)
 Complete physical exam  Patient: Kristopher Snyder   DOB: [birth year]   53 y.o. Male  MRN: [id number]  Subjective:    Chief Complaint  Patient presents with   Transitions Of Care   Annual Exam    Kristopher Snyder is a 53 y.o. male who presents today for a complete physical exam. He reports consuming a sporadic diet; consumes alcohol regularly. The patient does not participate in regular exercise at present. He generally feels poorly. He reports sleeping poorly. He does have additional problems to discuss today.   Discussed the use of AI scribe software for clinical note transcription with the patient, who gave verbal consent to proceed.  History of Present Illness   Kristopher Snyder is a 53 year old male with a history of chronic pain and insomnia who presents with insomnia and chronic pain management issues.  He experiences significant insomnia, describing his nights as a 'slide show' of racing thoughts that prevent him from sleeping. This issue has been more prominent since a hospitalization two years ago. He sometimes cannot sleep at all, moving between the living room and bed in an attempt to rest.  He reports chronic pain, stating he has arthritis, with pain starting in his back and sometimes leading to headaches that affect his vision. He uses Motrin  for pain relief but finds it ineffective and expresses a need for stronger medication. He also reports occasional knee pain.  He has been taking Alprazolam  for over ten years as needed, which he finds helpful. He was previously prescribed Lexapro  but stopped taking it as he felt it was not effective. He was hospitalized in Jan 2024 at [facility] due to hallucinations, delirium tremens, benzo withdrawal, and GAD. His symptoms have worsened since. He does follow with Dr. Dallas Schmitz at [facility]. He is receiving his benzos from this specialist.  He also takes medication for cholesterol and blood sugar  management.  Socially, he lives alone and feels isolated, with his two dogs as his primary support. His entire family is deceased, and he has no friends or neighbors he trusts. He consumes alcohol in the evenings, sometimes more than two drinks, but does not consider it a problem.  He experiences seasonal allergies, with symptoms including sneezing attacks and a sensation of water in his ear, which sometimes feels closed up. He also reports occasional constipation, which causes discomfort in his lower abdomen.  No thoughts of self-harm.        Most recent fall risk assessment:     No data to display           Most recent depression screenings:    01/15/2023    8:58 AM 06/19/2022    8:22 AM  PHQ 2/9 Scores  PHQ - 2 Score 6 4  PHQ- 9 Score 16 15    Vision:Not within last year  and Dental: No current dental problems and No regular dental care   Patient Active Problem List   Diagnosis Date Noted   C. difficile diarrhea 10/17/2022   Delirium tremens (HCC) 09/24/2022   Cirrhosis of liver (HCC) 09/24/2022   Visit for suture removal 07/19/2022   Infected sebaceous cyst of skin, right upper chest 06/19/2022   Positive colorectal cancer screening using Cologuard test 10/09/2021   Insomnia 09/17/2021   Controlled type 2 diabetes mellitus without complication, without long-term current use of insulin (HCC) 09/07/2021   Myopia of both eyes 09/06/2021   Verruca 09/15/2019   Depression, major,  single episode, moderate (HCC) 08/18/2019   Obstructive sleep apnea 08/18/2019   Rosacea 12/26/2017   External hemorrhoids 12/26/2017   Vertigo 08/07/2017   Anxiety and depression 08/07/2017   Primary osteoarthritis of right knee 07/10/2017   Degenerative disc disease, cervical 05/20/2016   Lumbar degenerative disc disease 05/20/2016   Esophageal reflux 02/06/2013   Obesity (BMI 30-39.9) 11/25/2012   Transaminitis 11/24/2012   Hyperlipidemia 11/24/2012   Annual physical exam 09/10/2012    Past Medical History:  Diagnosis Date   Hyperlipidemia 11/24/2012   Past Surgical History:  Procedure Laterality Date   HEMORRHOID SURGERY     Social History   Tobacco Use   Smoking status: Former    Current packs/day: 0.00    Average packs/day: 1 pack/day for 20.0 years (20.0 ttl pk-yrs)    Types: Cigarettes    Start date: 09/02/1984    Quit date: 09/02/2004    Years since quitting: 19.6   Smokeless tobacco: Never  Vaping Use   Vaping status: Never Used  Substance Use Topics   Alcohol use: Yes   Drug use: No      Patient Care Team: Lowella Benton CROME, PA as PCP - General (Physician Assistant)   Outpatient Medications Prior to Visit  Medication Sig   alprazolam  (XANAX ) 2 MG tablet Take 1 tablet (2 mg total) by mouth 3 (three) times daily.   alprazolam  (XANAX ) 2 MG tablet Take 1 tablet (2 mg total) by mouth 3 (three) times daily.   alprazolam  (XANAX ) 2 MG tablet Take 1 tablet (2 mg total) by mouth 3 (three) times daily.   atorvastatin  (LIPITOR) 10 MG tablet Take 1 tablet (10 mg total) by mouth daily.   Dulaglutide  (TRULICITY ) 3 MG/0.5ML SOAJ Inject 3 mg into the skin once a week.   ibuprofen  (ADVIL ) 800 MG tablet TAKE 1 TABLET BY MOUTH 3 TIMES DAILY AS NEEDED.   [DISCONTINUED] escitalopram  (LEXAPRO ) 20 MG tablet Take 1 tablet (20 mg total) by mouth daily. NEEDS APPOINTMENT FOR FURTHER REFILLS   No facility-administered medications prior to visit.    ROS Complete 12 point ROS performed with all pertinent positives listed in HPI      Objective:     BP (!) 142/101   Pulse 86   Resp 18   Ht 6' 1 (1.854 m)   Wt 274 lb (124.3 kg)   SpO2 96%   BMI 36.15 kg/m  BP Readings from Last 3 Encounters:  04/30/24 (!) 142/101  02/26/23 116/78  01/15/23 122/85   Wt Readings from Last 3 Encounters:  04/30/24 274 lb (124.3 kg)  02/26/23 289 lb (131.1 kg)  01/15/23 281 lb (127.5 kg)      Physical Exam Vitals and nursing note reviewed. Exam conducted with a chaperone  present.  Constitutional:      General: He is not in acute distress.    Appearance: Normal appearance. He is not ill-appearing, toxic-appearing or diaphoretic.  HENT:     Head: Normocephalic and atraumatic.     Right Ear: Tympanic membrane, ear canal and external ear normal. There is no impacted cerumen.     Left Ear: Tympanic membrane, ear canal and external ear normal. There is no impacted cerumen.     Nose: Nose normal.     Mouth/Throat:     Mouth: Mucous membranes are moist.     Pharynx: Oropharynx is clear. No oropharyngeal exudate or posterior oropharyngeal erythema.  Eyes:     General: No scleral icterus.  Right eye: No discharge.        Left eye: No discharge.     Extraocular Movements: Extraocular movements intact.     Pupils: Pupils are equal, round, and reactive to light.  Neck:     Thyroid : No thyroid  mass, thyromegaly or thyroid  tenderness.  Cardiovascular:     Rate and Rhythm: Normal rate and regular rhythm.     Pulses: Normal pulses.     Heart sounds: No murmur heard. Pulmonary:     Effort: Pulmonary effort is normal. No respiratory distress.     Breath sounds: Normal breath sounds. No stridor. No wheezing or rhonchi.  Abdominal:     General: Abdomen is flat. Bowel sounds are normal. There is no distension.     Palpations: Abdomen is soft. There is no mass.     Tenderness: There is no abdominal tenderness. There is no guarding.  Musculoskeletal:     Cervical back: Normal range of motion and neck supple. No rigidity or tenderness.     Right lower leg: No edema.     Left lower leg: No edema.  Lymphadenopathy:     Cervical: No cervical adenopathy.  Skin:    General: Skin is warm and dry.     Coloration: Skin is not jaundiced.     Findings: Lesion (generalized maculopapular erythematous scaling lesions to face and scalp) present. No bruising or erythema.  Neurological:     General: No focal deficit present.     Mental Status: He is alert and oriented to  person, place, and time.     Sensory: No sensory deficit.     Motor: No weakness.  Psychiatric:        Attention and Perception: He is inattentive (refused eye contact).        Mood and Affect: Mood is depressed. Affect is tearful.        Behavior: Behavior normal.          Assessment & Plan:    Routine Health Maintenance and Physical Exam  Immunization History  Administered Date(s) Administered   Influenza,inj,Quad PF,6+ Mos 09/06/2021   Influenza-Unspecified 07/04/2018, 06/17/2019   Tdap 11/22/2014    Health Maintenance  Topic Date Due   FOOT EXAM  Never done   OPHTHALMOLOGY EXAM  Never done   Pneumococcal Vaccine: 50+ Years (1 of 2 - PCV) Never done   Hepatitis B Vaccines 19-59 Average Risk (1 of 3 - 19+ 3-dose series) Never done   Colonoscopy  Never done   Diabetic kidney evaluation - Urine ACR  03/28/2023   INFLUENZA VACCINE  04/02/2024   HEMOGLOBIN A1C  10/30/2024   DTaP/Tdap/Td (2 - Td or Tdap) 11/21/2024   Diabetic kidney evaluation - eGFR measurement  04/30/2025   Hepatitis C Screening  Completed   HIV Screening  Completed   HPV VACCINES  Aged Out   Meningococcal B Vaccine  Aged Out   COVID-19 Vaccine  Discontinued   Zoster Vaccines- Shingrix  Discontinued    Discussed health benefits of physical activity, and encouraged him to engage in regular exercise appropriate for his age and condition.  Problem List Items Addressed This Visit     Hyperlipidemia   Relevant Orders   Lipid panel (Completed)   Insomnia   Relevant Medications   amitriptyline  (ELAVIL ) 50 MG tablet   Cirrhosis of liver (HCC)   Relevant Orders   Comprehensive metabolic panel with GFR (Completed)   Vitamin B1 (Completed)   B12 and Folate Panel (Completed)  Lumbar degenerative disc disease   Relevant Medications   celecoxib  (CELEBREX ) 200 MG capsule   Controlled type 2 diabetes mellitus without complication, without long-term current use of insulin (HCC)   Relevant Orders    Hemoglobin A1c (Completed)   Comprehensive metabolic panel with GFR (Completed)   Other Visit Diagnoses       Routine adult health maintenance    -  Primary   Relevant Orders   CBC with Differential/Platelet (Completed)   Hemoglobin A1c (Completed)   TSH (Completed)   Lipid panel (Completed)   Comprehensive metabolic panel with GFR (Completed)   PSA (Completed)     Prostate cancer screening       Relevant Orders   PSA (Completed)     Severe episode of recurrent major depressive disorder, without psychotic features (HCC)       Relevant Medications   amitriptyline  (ELAVIL ) 50 MG tablet     Isolation (social)       Relevant Orders   AMB Referral [facility] Care Management     Need for community resource       Relevant Orders   AMB Referral [facility] Care Management      Return in about 4 weeks (around 05/28/2024).   Assessment and Plan    Adult Wellness Visit Adult wellness visit focused on addressing multiple health concerns and symptoms. Pt not interested in addressing colonscopy and other preventive measures at this visit.  Type 2 diabetes mellitus Type 2 diabetes mellitus with prescribed medication, but patient is not currently taking it; management plan discussed. Check A1C today  Hyperlipidemia Hyperlipidemia with prescribed medication, but patient is not currently taking it; management plan discussed. Check lipid panel today  Depression and anxiety symptoms with chronic insomnia Chronic depression and anxiety symptoms with insomnia. Current medication includes alprazolam , which he finds helpful. I do have some concern with his concomitant ETOH intake. Lexapro  was ineffective. Elavil  discussed for sleep, headache prevention, and depression/anxiety. - Refer to [facility] for virtual mental health support. I feel that patient needs more extensive therapy / counseling than what is currently being provided. - Start Elavil  for sleep, headache prevention, and  depression/anxiety. - Will need to continue refills of xanax  from specialist.  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 Current pain management with Motrin  is ineffective. - trial of daily bid celebrex  for pain management.  Constipation Intermittent constipation reported.  Seasonal allergic rhinitis Seasonal allergic rhinitis with symptoms of sneezing and ear fullness.  Skin lesions face and scalp I suspect this to be secondary to ETOH intake, possibly rosacea. Pt is concerned about finances and ability to afford medications. Will defer topical metronidazole  at this time and monitor.  ETOH abuse Likely contributing to depression, anxiety and insomnia.  Referral placed to [facility] to help pt manage and cut back. May need formal outpatient referral     Return in 4 weeks for follow up of labs and medication review.    Benton LITTIE Gave, PA

## 2024-05-03 ENCOUNTER — Other Ambulatory Visit: Payer: Self-pay

## 2024-05-04 ENCOUNTER — Telehealth: Payer: Self-pay | Admitting: *Deleted

## 2024-05-04 ENCOUNTER — Other Ambulatory Visit (HOSPITAL_COMMUNITY): Payer: Self-pay

## 2024-05-04 NOTE — Progress Notes (Signed)
 Complex Care Management Note  Care Guide Note 05/04/2024 Name: Juvencio Verdi MRN: [id number] DOB: 10/11/1970  Lorry Anastasi is a 53 y.o. year old male who sees [place], [place] L, [place] for primary care. I reached out to [employer] by phone today to offer complex care management services.  Mr. Severe was given information about Complex Care Management services today including:   The Complex Care Management services include support from the care team which includes your Nurse Care Manager, Clinical Social Worker, or Pharmacist.  The Complex Care Management team is here to help remove barriers to the health concerns and goals most important to you. Complex Care Management services are voluntary, and the patient may decline or stop services at any time by request to their care team member.   Complex Care Management Consent Status: Patient agreed to services and verbal consent obtained.   Follow up plan:  Telephone appointment with complex care management team member scheduled for:  05/11/2024  Encounter Outcome:  Patient Scheduled  Thedford Franks, CMA [place]  [facility], [facility] Guide Direct Dial: [phone]  Fax: [phone] Website: [email or web address]

## 2024-05-05 ENCOUNTER — Telehealth: Payer: Self-pay

## 2024-05-05 ENCOUNTER — Other Ambulatory Visit: Payer: Self-pay

## 2024-05-05 ENCOUNTER — Other Ambulatory Visit: Payer: Self-pay | Admitting: Urgent Care

## 2024-05-05 ENCOUNTER — Other Ambulatory Visit (HOSPITAL_COMMUNITY): Payer: Self-pay

## 2024-05-05 MED FILL — Dulaglutide Soln Auto-injector 3 MG/0.5ML: SUBCUTANEOUS | 28 days supply | Qty: 2 | Fill #0 | Status: AC

## 2024-05-05 NOTE — Telephone Encounter (Signed)
 Copied from CRM #[id number]. Topic: Clinical - Lab/Test Results >> May 05, 2024  8:25 AM Diannia H wrote: Reason for CRM: Patient called and wanted to know the results of his labs, as we were going over them he had some additional questions. Could you please assist and call him back at [phone] and go over those results with him?

## 2024-05-05 NOTE — Telephone Encounter (Signed)
 I did call in Trulicity  for him. He does need to schedule a one month follow up with me in office please. He did not do this upon discharge. thanks

## 2024-05-05 NOTE — Telephone Encounter (Signed)
 Spoke with patient - was requesting rx rf of Trulicity   Refill request sent to provider for review.

## 2024-05-05 NOTE — Telephone Encounter (Signed)
 Sending to PA team - Jardiance  is needing PA   Sending to whitney crain Patient states he is taking Trulicitiy and Lipitor ( that he never stopped these)   He states he was told he had wax in ears at last visit  but nothing was done regarding this at the time - is  Debrox o.k. to have patient use to see if this clears ?  He also states he has been having a stabbing feeling under right rib cage just where the rib cage ends . States when he breathes it hurts. States had same feeling that was previously below his belly button - coming and going x 1 month.

## 2024-05-05 NOTE — Telephone Encounter (Signed)
 I will refill. We talked about a lot at pt's last office visit, he was supposed to schedule a one month follow up with me please

## 2024-05-05 NOTE — Telephone Encounter (Signed)
 Patient called back. States he just spoke to you. He has a question about his labs.

## 2024-05-05 NOTE — Telephone Encounter (Signed)
 Copied from CRM #[id number]. Topic: Clinical - Lab/Test Results >> May 05, 2024  3:04 PM Suzette B wrote: Reason for CRM: Patient called in from [phone], he stated he had just spoken with Ms. Caige Almeda and needed to speak her once again

## 2024-05-05 NOTE — Telephone Encounter (Signed)
 Patient requesting Trulicity  refill  Last written 07/03/2023 Last OV 04/30/2024 Upcoming appt = none

## 2024-05-06 ENCOUNTER — Encounter: Payer: Self-pay | Admitting: Sports Medicine

## 2024-05-06 ENCOUNTER — Other Ambulatory Visit (HOSPITAL_COMMUNITY): Payer: Self-pay

## 2024-05-06 ENCOUNTER — Other Ambulatory Visit: Payer: Self-pay

## 2024-05-06 ENCOUNTER — Telehealth: Payer: Self-pay

## 2024-05-06 NOTE — Telephone Encounter (Signed)
 Attempted call to patient to inform him that prescription has been sent to pharmacy - left a voice mail message requesting a return call.

## 2024-05-06 NOTE — Telephone Encounter (Signed)
 Pharmacy Patient Advocate Encounter   Received notification from Pt Calls Messages that prior authorization for Jardiance  25mg  tabs is required/requested.   Insurance verification completed.   The patient is insured through [facility] .   Per test claim: PA required; PA started via CoverMyMeds. KEY [id number] . Waiting for clinical questions to populate.

## 2024-05-06 NOTE — Telephone Encounter (Signed)
 Clinical questions answered and PA submitted.

## 2024-05-07 ENCOUNTER — Other Ambulatory Visit (HOSPITAL_COMMUNITY): Payer: Self-pay

## 2024-05-07 ENCOUNTER — Other Ambulatory Visit: Payer: Self-pay | Admitting: Urgent Care

## 2024-05-07 DIAGNOSIS — E1165 Type 2 diabetes mellitus with hyperglycemia: Secondary | ICD-10-CM

## 2024-05-07 MED ORDER — METFORMIN HCL ER 500 MG PO TB24
500.0000 mg | ORAL_TABLET | Freq: Two times a day (BID) | ORAL | 0 refills | Status: DC
  Filled 2024-05-07: qty 60, 30d supply, fill #0
  Filled 2024-05-30: qty 60, 30d supply, fill #1
  Filled 2024-06-29: qty 60, 30d supply, fill #2

## 2024-05-07 NOTE — Telephone Encounter (Signed)
 Spoke with patient and informed that prescription refill has been sent to pharmacy and that [facility] had stated that the medication was in the mail to patient at the time of the call.

## 2024-05-07 NOTE — Telephone Encounter (Signed)
 Pharmacy Patient Advocate Encounter  Received notification from [facility] that Prior Authorization for Jardiance  25mg  tabs has been DENIED.  See denial reason below. No denial letter attached in CMM. Will attach denial letter to Media tab once received.   PA #/Case ID/Reference #: [id number]

## 2024-05-07 NOTE — Progress Notes (Signed)
 Jardiance  is not covered as metformin  must be tried first. Therefore, I have called in Rx of metformin . Pt will need follow up for DM in 3 mo.

## 2024-05-07 NOTE — Telephone Encounter (Signed)
 Kim advised patient.

## 2024-05-07 NOTE — Telephone Encounter (Signed)
 Patient informed that since Jardiance  was denied - metformin  was sent instead. Instructed him take two times per day with meals and to continue taking the Trulicity  injection once weekly as well.

## 2024-05-07 NOTE — Telephone Encounter (Signed)
 Please notify pt that the jardiance  was not covered by his insurance thus I will call in metformin  instead. He is to remain on his trulicity . He also needs to schedule follow up visit in office.

## 2024-05-07 NOTE — Telephone Encounter (Signed)
 Patient informed.

## 2024-05-08 ENCOUNTER — Other Ambulatory Visit (HOSPITAL_COMMUNITY): Payer: Self-pay

## 2024-05-08 LAB — COMPREHENSIVE METABOLIC PANEL WITH GFR
ALT: 64 IU/L — ABNORMAL HIGH (ref 0–44)
AST: 121 IU/L — ABNORMAL HIGH (ref 0–40)
Albumin: 3.7 g/dL — ABNORMAL LOW (ref 3.8–4.9)
Alkaline Phosphatase: 106 IU/L (ref 44–121)
BUN/Creatinine Ratio: 11 (ref 9–20)
BUN: 8 mg/dL (ref 6–24)
Bilirubin Total: 1.1 mg/dL (ref 0.0–1.2)
CO2: 19 mmol/L — ABNORMAL LOW (ref 20–29)
Calcium: 8.8 mg/dL (ref 8.7–10.2)
Chloride: 102 mmol/L (ref 96–106)
Creatinine, Ser: 0.71 mg/dL — ABNORMAL LOW (ref 0.76–1.27)
Globulin, Total: 3.9 g/dL (ref 1.5–4.5)
Glucose: 153 mg/dL — ABNORMAL HIGH (ref 70–99)
Potassium: 3.9 mmol/L (ref 3.5–5.2)
Sodium: 137 mmol/L (ref 134–144)
Total Protein: 7.6 g/dL (ref 6.0–8.5)
eGFR: 110 mL/min/1.73 (ref >59)

## 2024-05-08 LAB — CBC WITH DIFFERENTIAL/PLATELET
Basophils Absolute: 0 x10E3/uL (ref 0.0–0.2)
Basos: 0 %
EOS (ABSOLUTE): 0.1 x10E3/uL (ref 0.0–0.4)
Eos: 2 %
Hematocrit: 49 % (ref 37.5–51.0)
Hemoglobin: 16.6 g/dL (ref 13.0–17.7)
Immature Grans (Abs): 0 x10E3/uL (ref 0.0–0.1)
Immature Granulocytes: 0 %
Lymphocytes Absolute: 1.2 x10E3/uL (ref 0.7–3.1)
Lymphs: 24 %
MCH: 36.6 pg — ABNORMAL HIGH (ref 26.6–33.0)
MCHC: 33.9 g/dL (ref 31.5–35.7)
MCV: 108 fL — ABNORMAL HIGH (ref 79–97)
Monocytes Absolute: 0.5 x10E3/uL (ref 0.1–0.9)
Monocytes: 11 %
Neutrophils Absolute: 3 x10E3/uL (ref 1.4–7.0)
Neutrophils: 63 %
Platelets: 107 x10E3/uL — ABNORMAL LOW (ref 150–450)
RBC: 4.54 x10E6/uL (ref 4.14–5.80)
RDW: 12.5 % (ref 11.6–15.4)
WBC: 4.8 x10E3/uL (ref 3.4–10.8)

## 2024-05-08 LAB — LIPID PANEL
Chol/HDL Ratio: 6.9 ratio — ABNORMAL HIGH (ref 0.0–5.0)
Cholesterol, Total: 221 mg/dL — ABNORMAL HIGH (ref 100–199)
HDL: 32 mg/dL — ABNORMAL LOW (ref >39)
LDL Chol Calc (NIH): 152 mg/dL — ABNORMAL HIGH (ref 0–99)
Triglycerides: 201 mg/dL — ABNORMAL HIGH (ref 0–149)
VLDL Cholesterol Cal: 37 mg/dL (ref 5–40)

## 2024-05-08 LAB — VITAMIN B1: Thiamine: 108.5 nmol/L (ref 66.5–200.0)

## 2024-05-08 LAB — B12 AND FOLATE PANEL
Folate: 15.2 ng/mL (ref >3.0)
Vitamin B-12: 792 pg/mL (ref 232–1245)

## 2024-05-08 LAB — HEMOGLOBIN A1C
Est. average glucose Bld gHb Est-mCnc: 212 mg/dL
Hgb A1c MFr Bld: 9 % — ABNORMAL HIGH (ref 4.8–5.6)

## 2024-05-08 LAB — PSA: Prostate Specific Ag, Serum: 0.2 ng/mL (ref 0.0–4.0)

## 2024-05-08 LAB — TSH: TSH: 2.2 u[IU]/mL (ref 0.450–4.500)

## 2024-05-10 ENCOUNTER — Other Ambulatory Visit: Payer: Self-pay

## 2024-05-11 ENCOUNTER — Encounter: Payer: Self-pay | Admitting: Licensed Clinical Social Worker

## 2024-05-11 ENCOUNTER — Telehealth: Payer: Self-pay | Admitting: Licensed Clinical Social Worker

## 2024-05-14 ENCOUNTER — Other Ambulatory Visit: Payer: Self-pay

## 2024-05-17 ENCOUNTER — Telehealth: Payer: Self-pay | Admitting: *Deleted

## 2024-05-17 NOTE — Progress Notes (Unsigned)
 Complex Care Management Care Guide Note  05/17/2024 Name: Kristopher Snyder MRN: [id number] DOB: 11-19-1970  [name] is a 53 y.o. year old male who is a primary care patient of [place], Whitney L, [place] and is actively engaged with the care management team. I reached out to [employer] by phone today to assist with re-scheduling  with the Licensed Clinical Child psychotherapist.  Follow up plan: Unsuccessful telephone outreach attempt made. A HIPAA compliant phone message was left for the patient providing contact information and requesting a return call.  Thedford Franks, CMA, Care Guide [facility]  [facility], [facility] Guide Direct Dial: [phone]  Fax: [phone] Website: [email or web address]

## 2024-05-18 NOTE — Progress Notes (Signed)
 Complex Care Management Care Guide Note  05/18/2024 Name: Kristopher Snyder MRN: [id number] DOB: 1971/07/24  Kristopher Snyder is a 52 y.o. year old male who is a primary care patient of [place], Whitney L, [place] and is actively engaged with the care management team. I reached out to [employer] by phone today to assist with re-scheduling  with the Licensed Clinical Child psychotherapist.  Follow up plan: Telephone appointment with complex care management team member scheduled for:  06/01/2024  Kristopher Snyder, CMA [place]  [facility], [facility] Guide Direct Dial: [phone]  Fax: [phone] Website: [email or web address]

## 2024-05-21 ENCOUNTER — Ambulatory Visit: Admitting: Medical-Surgical

## 2024-05-24 ENCOUNTER — Other Ambulatory Visit: Payer: Self-pay

## 2024-05-24 ENCOUNTER — Other Ambulatory Visit (HOSPITAL_COMMUNITY): Payer: Self-pay

## 2024-05-24 ENCOUNTER — Encounter: Payer: Self-pay | Admitting: Physician Assistant

## 2024-05-24 ENCOUNTER — Ambulatory Visit: Admitting: Physician Assistant

## 2024-05-24 VITALS — BP 138/93 | HR 104 | Temp 98.3°F | Ht 73.0 in | Wt 280.0 lb

## 2024-05-24 DIAGNOSIS — R0981 Nasal congestion: Secondary | ICD-10-CM | POA: Diagnosis not present

## 2024-05-24 DIAGNOSIS — G4709 Other insomnia: Secondary | ICD-10-CM | POA: Diagnosis not present

## 2024-05-24 DIAGNOSIS — H6123 Impacted cerumen, bilateral: Secondary | ICD-10-CM | POA: Diagnosis not present

## 2024-05-24 DIAGNOSIS — M5136 Other intervertebral disc degeneration, lumbar region with discogenic back pain only: Secondary | ICD-10-CM

## 2024-05-24 MED ORDER — DEBROX 6.5 % OT SOLN
10.0000 [drp] | Freq: Two times a day (BID) | OTIC | 1 refills | Status: AC
  Filled 2024-05-24: qty 15, 15d supply, fill #0
  Filled 2024-06-02: qty 15, 15d supply, fill #1

## 2024-05-24 MED ORDER — FLUTICASONE PROPIONATE 50 MCG/ACT NA SUSP
2.0000 | Freq: Every day | NASAL | 0 refills | Status: DC
  Filled 2024-05-24: qty 16, 30d supply, fill #0

## 2024-05-24 NOTE — Telephone Encounter (Signed)
 Spoke with patient, he is not sure what it is taking. He is scheduled for a nurse visit to straighten this out. Patient was asked to bring all his medication and come to the office tomorrow at 1030a.

## 2024-05-24 NOTE — Progress Notes (Signed)
 Acute Office Visit  Subjective:     Patient ID: Kristopher Snyder, male    DOB: 1970-12-02, 53 y.o.   MRN: [id number]  Chief Complaint  Patient presents with   Cough    HPI Patient is a male with PMH of chronic sinus congestion. He is confused about his medications and poor historian with his symptoms. He is in today for bilateral ear fullness and tinnitus. He states the tinnitus sounds like ringing that varies in pitch. It has been going on for years and he has had to get his ears cleaned out multiple times in the past. He denies any ear pain or acute illness. He admits to having a hard time hearing but cannot pinpoint if he has more trouble with low pitched sounds or high. He is also positive for dizziness that he occasionally feels when he wakes up in the morning. He describes it as a feeling of being off balance, and he has had to stabilize himself on the wall, but he has never fallen. He is positive for sinus congestion which he states has been a chronic issue for him, but he has never taken anything for it.  He is also inquiring about some of his medications today. He brought in his Amitriptyline  and Celebrex  to ask what they are for. He hasn't been taking them because he can't remember why he has them. He also admits to being noncompliant with the rest of the medications on his list. The only ones he can confidently say he takes appropriately is the Trulicity  and the Xanax .   ROS See HPI.      Objective:    BP (!) 138/93 (BP Location: Left Arm, Patient Position: Sitting, Cuff Size: Large)   Pulse (!) 104   Temp 98.3 F (36.8 C) (Oral)   Ht 6' 1 (1.854 m)   Wt 280 lb (127 kg)   SpO2 99%   BMI 36.94 kg/m  BP Readings from Last 3 Encounters:  05/24/24 (!) 138/93  04/30/24 (!) 142/101  02/26/23 116/78   Wt Readings from Last 3 Encounters:  05/24/24 280 lb (127 kg)  04/30/24 274 lb (124.3 kg)  02/26/23 289 lb (131.1 kg)   .SABRACerumen Removal Template: Indication: Cerumen  impaction of the ear(s) Medical necessity statement: On physical examination, cerumen impairs clinically significant portions of the external auditory canal, and tympanic membrane. Noted obstructive, copious cerumen that cannot be removed without magnification and instrumentations requiring physician skills Consent: Discussed benefits and risks of procedure and verbal consent obtained Procedure: Patient was prepped for the procedure. Utilized an otoscope to assess and take note of the ear canal, the tympanic membrane, and the presence, amount, and placement of the cerumen. Gentle water irrigation and soft plastic curette was utilized to remove cerumen.  Post procedure examination shows cerumen was partially removed. Patient tolerated procedure well. The patient is made aware that they may experience temporary vertigo, temporary hearing loss, and temporary discomfort. If these symptom last for more than 24 hours to call the clinic or proceed to the ED.  Physical Exam Constitutional:      Appearance: Normal appearance. He is obese.  HENT:     Head: Normocephalic.     Right Ear: There is impacted cerumen.     Left Ear: There is impacted cerumen.     Ears:     Comments: Bilateral cerumen impaction with hearing loss.  After irrigation: right gross hearing intact with some visulization of TM Left cerumen still impacted.  No pain with palpation to tragus or auricle    Nose: Nose normal. No congestion or rhinorrhea.     Mouth/Throat:     Mouth: Mucous membranes are moist.     Pharynx: No oropharyngeal exudate.  Eyes:     Conjunctiva/sclera: Conjunctivae normal.  Cardiovascular:     Rate and Rhythm: Normal rate.  Pulmonary:     Effort: Pulmonary effort is normal.  Neurological:     Mental Status: He is alert and oriented to person, place, and time.  Psychiatric:        Mood and Affect: Mood normal.          Assessment & Plan:  SABRASABRAMurl was seen today for cough.  Diagnoses and all  orders for this visit:  Bilateral hearing loss due to cerumen impaction -     carbamide peroxide (DEBROX) 6.5 % OTIC solution; Place 10 drops into both ears 2 (two) times daily. Dispense 1 bottle  Nasal congestion -     fluticasone  (FLONASE ) 50 MCG/ACT nasal spray; 2 sprays each nostrils for nasal congestion.  Other insomnia  Degeneration of intervertebral disc of lumbar region with discogenic back pain   Significant amount of cerumen was removed from both ears with gross hearing intact with right ear but no gross hearing with left ear.  Use debrox daily for 2 weeks and come back for ear irrigation Flonase  to start for congestion Discussed amitripyline for sleep at bedtime Discussed celebrex  for DDD for pain Encouraged patient to take regularly Pt declined flu shot.  Follow up in 2 weeks  Spent 30 minutes with patient discussing treatment plan and medications and how to take them and what they are for.   Return in about 2 weeks (around 06/07/2024), or if symptoms worsen or fail to improve.  Lyon Dumont, PA-C

## 2024-05-24 NOTE — Patient Instructions (Addendum)
 Use debrox for ears for next 2 weeks at least once a day then come back to have ears cleaned out again.  Start flonase  daily.

## 2024-05-25 ENCOUNTER — Ambulatory Visit

## 2024-05-26 ENCOUNTER — Other Ambulatory Visit (HOSPITAL_COMMUNITY): Payer: Self-pay

## 2024-05-27 MED FILL — Dulaglutide Soln Auto-injector 3 MG/0.5ML: SUBCUTANEOUS | 28 days supply | Qty: 2 | Fill #1 | Status: AC

## 2024-05-31 ENCOUNTER — Ambulatory Visit: Payer: Self-pay

## 2024-05-31 NOTE — Telephone Encounter (Signed)
 FYI Only or Action Required?: FYI only for provider.  Patient was last seen in primary care on 05/24/2024 by Antoniette Vermell CROME, PA-C.  Called Nurse Triage reporting Ear Drainage (Blood on Q-tip when cleaning ear, ear drops).  Symptoms began yesterday.  Interventions attempted: OTC medications: Ibuprofen  and Prescription medications: carbamide peroxide drops, celebrex .  Symptoms are: unchanged.  Triage Disposition: See Physician Within 24 Hours  Patient/caregiver understands and will follow disposition?: Yes  Copied from CRM [id number]. Topic: Clinical - Red Word Triage >> May 31, 2024  2:05 PM Debby BROCKS wrote: Red Word that prompted transfer to Nurse Triage: Patient is concerned that he is using ear drops and when he uses a que tip with it, blood comes out of his ear Reason for Disposition  Hearing is decreased in injured ear  Answer Assessment - Initial Assessment Questions 1. MECHANISM: How did the injury happen?      Had ears cleaned on 9/22 in office. Prescribed carbamide peroxide ear drops BID. Noticed moderate amount of blood on Q-tip when cleaning left ear over the weekend, scant amount noted on Q-tip when cleaning right ear. 2. ONSET: When did the injury happen? (e.g., minutes, hours ago)      9/22 ear cleaning. Onset of bleeding over the weekend. 3. LOCATION: What part of the ear is injured? Which ear is injured?     Left ear primarily, scant amount of blood on q-tip when cleaning right ear.  4. APPEARANCE: What does the ear look like?      Blood on q-tip. 5. HEARING: Was the hearing damaged?      Right ear ok, left ear HOH, chronic 6. SIZE: For cuts, bruises, or swelling, ask: How large is it? (e.g., inches or centimeters)     No. 7. PAIN: Is it painful? If Yes, ask: How bad is the pain? (e.g., Scale 0-10; none, mild, moderate, severe)     Left ear a little painful. Advised to avoid ibuprofen  d/t bleed risk and continue taking his celebrex  as needed for  pain. 9. OTHER SYMPTOMS: Do you have any other symptoms? (e.g., neck pain, headache, loss of consciousness)     Sinus issues, chronic.  Protocols used: Ear Injury-A-AH

## 2024-05-31 NOTE — Telephone Encounter (Signed)
 Patient scheduled tomorrow with vermell Bologna

## 2024-06-01 ENCOUNTER — Other Ambulatory Visit: Payer: Self-pay | Admitting: Licensed Clinical Social Worker

## 2024-06-01 ENCOUNTER — Other Ambulatory Visit: Payer: Self-pay

## 2024-06-01 ENCOUNTER — Other Ambulatory Visit (HOSPITAL_COMMUNITY): Payer: Self-pay

## 2024-06-01 ENCOUNTER — Ambulatory Visit (INDEPENDENT_AMBULATORY_CARE_PROVIDER_SITE_OTHER): Admitting: Physician Assistant

## 2024-06-01 VITALS — BP 144/97 | HR 98 | Ht 73.0 in | Wt 283.0 lb

## 2024-06-01 DIAGNOSIS — Z7985 Long-term (current) use of injectable non-insulin antidiabetic drugs: Secondary | ICD-10-CM

## 2024-06-01 DIAGNOSIS — H60392 Other infective otitis externa, left ear: Secondary | ICD-10-CM | POA: Diagnosis not present

## 2024-06-01 DIAGNOSIS — Z7189 Other specified counseling: Secondary | ICD-10-CM | POA: Diagnosis not present

## 2024-06-01 DIAGNOSIS — F5101 Primary insomnia: Secondary | ICD-10-CM | POA: Diagnosis not present

## 2024-06-01 DIAGNOSIS — E1165 Type 2 diabetes mellitus with hyperglycemia: Secondary | ICD-10-CM | POA: Diagnosis not present

## 2024-06-01 DIAGNOSIS — E782 Mixed hyperlipidemia: Secondary | ICD-10-CM | POA: Diagnosis not present

## 2024-06-01 DIAGNOSIS — F332 Major depressive disorder, recurrent severe without psychotic features: Secondary | ICD-10-CM

## 2024-06-01 DIAGNOSIS — Z7984 Long term (current) use of oral hypoglycemic drugs: Secondary | ICD-10-CM | POA: Diagnosis not present

## 2024-06-01 DIAGNOSIS — H6122 Impacted cerumen, left ear: Secondary | ICD-10-CM

## 2024-06-01 MED ORDER — CIPROFLOXACIN-DEXAMETHASONE 0.3-0.1 % OT SUSP
4.0000 [drp] | Freq: Two times a day (BID) | OTIC | 0 refills | Status: AC
  Filled 2024-06-01: qty 7.5, 7d supply, fill #0

## 2024-06-01 NOTE — Patient Instructions (Signed)
 Ciprodex is only for 7 days in left ear.

## 2024-06-01 NOTE — Patient Instructions (Signed)
 Visit Information  Thank you for taking time to visit with me today. Please don't hesitate to contact me if I can be of assistance to you before our next scheduled appointment.  Your next care management appointment is no further scheduled appointments.   Please call the care guide team at [phone] if you need to cancel, schedule, or reschedule an appointment.   Please call the Suicide and Crisis Lifeline: 988 if you are experiencing a Mental Health or Behavioral Health Crisis or need someone to talk to.  Hale Level, LCSW [place], [facility] Licensed Clinical Social Worker Care Coordinator [phone]

## 2024-06-01 NOTE — Patient Outreach (Signed)
 Complex Care Management   Visit Note  06/01/2024  Name:  Adrion Menz MRN: [id number] DOB: 08-17-1971  Situation: Referral received for Complex Care Management related to Mental/Behavioral Health diagnosis MDD I obtained verbal consent from Patient.  Visit completed with Patient  on the phone  Background:   Past Medical History:  Diagnosis Date   Hyperlipidemia 11/24/2012    Assessment: Patient Reported Symptoms:  Cognitive Cognitive Status: Alert and oriented to person, place, and time, Normal speech and language skills, Insightful and able to interpret abstract concepts Cognitive/Intellectual Conditions Management [RPT]: None reported or documented in medical history or problem list   Health Maintenance Behaviors: Annual physical exam  Neurological Neurological Review of Symptoms: No symptoms reported    HEENT HEENT Symptoms Reported: No symptoms reported      Cardiovascular Cardiovascular Symptoms Reported: No symptoms reported    Respiratory Respiratory Symptoms Reported: No symptoms reported    Endocrine Endocrine Symptoms Reported: No symptoms reported    Gastrointestinal Gastrointestinal Symptoms Reported: No symptoms reported      Genitourinary Genitourinary Symptoms Reported: No symptoms reported    Integumentary Integumentary Symptoms Reported: No symptoms reported    Musculoskeletal Musculoskelatal Symptoms Reviewed: No symptoms reported        Psychosocial Psychosocial Symptoms Reported: No symptoms reported     Quality of Family Relationships: helpful, involved, supportive Do you feel physically threatened by others?: No    06/01/2024    PHQ2-9 Depression Screening   Little interest or pleasure in doing things Not at all  Feeling down, depressed, or hopeless Not at all  PHQ-2 - Total Score 0  Trouble falling or staying asleep, or sleeping too much    Feeling tired or having little energy    Poor appetite or overeating     Feeling bad about yourself  - or that you are a failure or have let yourself or your family down    Trouble concentrating on things, such as reading the newspaper or watching television    Moving or speaking so slowly that other people could have noticed.  Or the opposite - being so fidgety or restless that you have been moving around a lot more than usual    Thoughts that you would be better off dead, or hurting yourself in some way    PHQ2-9 Total Score    If you checked off any problems, how difficult have these problems made it for you to do your work, take care of things at home, or get along with other people    Depression Interventions/Treatment      There were no vitals filed for this visit.  Medications Reviewed Today     Reviewed by Kit Alm LABOR, LCSW (Social Worker) on 06/01/24 at 1103  Med List Status: <None>   Medication Order Taking? Sig Documenting Provider Last Dose Status Informant  alprazolam  (XANAX ) 2 MG tablet [id number] Yes Take 1 tablet (2 mg total) by mouth 3 (three) times daily.   Active   amitriptyline  (ELAVIL ) 50 MG tablet [id number] Yes Take 1 tablet (50 mg total) by mouth at bedtime. Crain, Whitney L, PA  Active   atorvastatin  (LIPITOR) 10 MG tablet [id number] Yes Take 1 tablet (10 mg total) by mouth daily. Curtis Debby PARAS, MD  Active   carbamide peroxide [facility]) 6.5 % OTIC solution [id number] Yes Place 10 drops into both ears 2 (two) times daily. Breeback, Jade L, PA-C  Active   celecoxib  (CELEBREX ) 200 MG capsule [id number] Yes Take 1 capsule (  200 mg total) by mouth 2 (two) times daily with a meal. One to 2 tablets by mouth daily as needed for pain. Crain, Whitney L, PA  Active   Dulaglutide  (TRULICITY ) 3 MG/0.5ML EMMANUEL [id number] Yes Inject 3 mg into the skin once a week. Crain, Whitney L, PA  Active   empagliflozin  (JARDIANCE ) 25 MG TABS tablet [id number] Yes Take 1 tablet (25 mg total) by mouth daily before breakfast. Lowella, Whitney L, PA  Active   fluticasone  (FLONASE ) 50 MCG/ACT  nasal spray [id number] Yes Place 2 sprays into both nostrils daily for nasal congestion. Antoniette Vermell CROME, PA-C  Active   ibuprofen  (ADVIL ) 800 MG tablet [id number] Yes TAKE 1 TABLET BY MOUTH 3 TIMES DAILY AS NEEDED. Curtis Debby PARAS, MD  Active   metFORMIN  (GLUCOPHAGE -XR) 500 MG 24 hr tablet [id number] Yes Take 1 tablet (500 mg total) by mouth 2 (two) times daily with a meal. Crain, Whitney L, PA  Active             Recommendation:   CSW spoke with pt and completed initial assessment. Pt denied any needs at this time - CSW reviewed possible benefits of CCM program. CSW encouraged pt to reach out to provider for additional referral if needed. Pt agreed.  Follow Up Plan:   CSW will close referral at this time.  Alm Armor, LCSW [place], [facility] Licensed Clinical Social Worker Care Coordinator [phone]

## 2024-06-02 ENCOUNTER — Other Ambulatory Visit: Payer: Self-pay | Admitting: Physician Assistant

## 2024-06-02 ENCOUNTER — Other Ambulatory Visit (HOSPITAL_COMMUNITY): Payer: Self-pay

## 2024-06-02 ENCOUNTER — Other Ambulatory Visit: Payer: Self-pay

## 2024-06-02 ENCOUNTER — Encounter: Payer: Self-pay | Admitting: Physician Assistant

## 2024-06-02 DIAGNOSIS — F332 Major depressive disorder, recurrent severe without psychotic features: Secondary | ICD-10-CM | POA: Insufficient documentation

## 2024-06-02 DIAGNOSIS — R0981 Nasal congestion: Secondary | ICD-10-CM

## 2024-06-02 DIAGNOSIS — E1165 Type 2 diabetes mellitus with hyperglycemia: Secondary | ICD-10-CM | POA: Insufficient documentation

## 2024-06-02 DIAGNOSIS — H6122 Impacted cerumen, left ear: Secondary | ICD-10-CM | POA: Insufficient documentation

## 2024-06-02 NOTE — Progress Notes (Signed)
 Established Patient Office Visit  Subjective   Patient ID: Kristopher Snyder, male    DOB: 1971-03-14  Age: 53 y.o. MRN: [id number]  No chief complaint on file.   HPI .Discussed the use of AI scribe software for clinical note transcription with the patient, who gave verbal consent to proceed.  History of Present Illness Kristopher Snyder is a 53 year old male who presents with continued left ear hearing loss and pressure that has not resolved with debrox use at home. He also brings a bag of medications to go over.   Otologic symptoms - Pressure and drainage in left ear, right feels much better since last irrigation - Difficulty removing earwax in left ear - Use of Debrox in both ears causes bubbling and heat sensation - Balance issues attributed to ear problems  Metabolic and endocrine health - Diabetes mellitus with regular A1c monitoring every three months - Currently taking metformin /jardiance /trulicity  for blood sugar control  Dyslipidemia - Currently taking atorvastatin  for cholesterol management  Mood and sleep disturbances - Currently taking escitalopram  for mood - not sure if truly compliant with medication as he has a large supply in his bag - Takes medication for sleep, which is effective  Musculoskeletal pain and inflammation - Currently taking Celebrex  for inflammation and pain  Nutrition - Not eating well  Medication management - Multiple bottles of the same medications from different pharmacies    ROS See HPI.    Objective:     BP (!) 144/97   Pulse 98   Ht 6' 1 (1.854 m)   Wt 283 lb (128.4 kg)   SpO2 100%   BMI 37.34 kg/m  BP Readings from Last 3 Encounters:  06/01/24 (!) 144/97  05/24/24 (!) 138/93  04/30/24 (!) 142/101   Wt Readings from Last 3 Encounters:  06/01/24 283 lb (128.4 kg)  05/24/24 280 lb (127 kg)  04/30/24 274 lb (124.3 kg)    .SABRACerumen Removal Template: Indication: Cerumen impaction of the left ear(s) Medical necessity  statement: On physical examination, cerumen impairs clinically significant portions of the external auditory canal, and tympanic membrane. Noted obstructive, copious cerumen that cannot be removed without magnification and instrumentations requiring physician skills Consent: Discussed benefits and risks of procedure and verbal consent obtained Procedure: Patient was prepped for the procedure. Utilized an otoscope to assess and take note of the ear canal, the tympanic membrane, and the presence, amount, and placement of the cerumen. Gentle water irrigation and soft plastic curette was utilized to remove cerumen.  Post procedure examination shows cerumen was completely removed. Patient tolerated procedure well. The patient is made aware that they may experience temporary vertigo, temporary hearing loss, and temporary discomfort. If these symptom last for more than 24 hours to call the clinic or proceed to the ED.   Physical Exam Constitutional:      Appearance: Normal appearance. He is obese.  HENT:     Head: Normocephalic.     Right Ear: Tympanic membrane, ear canal and external ear normal. There is no impacted cerumen.     Left Ear: There is impacted cerumen.     Ears:     Comments: Impacted left ear with cerumen. After irrigation TM able to be seen but entire canal erythematous with some appearance of more purulent discharge in canal and around TM.     Nose: Nose normal.     Mouth/Throat:     Mouth: Mucous membranes are moist.  Cardiovascular:     Rate and Rhythm:  Normal rate.  Pulmonary:     Effort: Pulmonary effort is normal.  Neurological:     General: No focal deficit present.     Mental Status: He is alert and oriented to person, place, and time.  Psychiatric:        Mood and Affect: Mood normal.      The 10-year ASCVD risk score (Arnett DK, et al., 2019) is: 17.9%    Assessment & Plan:  .Diagnoses and all orders for this visit:  Hearing loss of left ear due to cerumen  impaction  Encounter for medication review and counseling  Uncontrolled type 2 diabetes mellitus with hyperglycemia (HCC)  Other infective acute otitis externa of left ear -     ciprofloxacin-dexamethasone (CIPRODEX) OTIC suspension; Place 4 drops into the left ear 2 (two) times daily for 7 days.  Severe episode of recurrent major depressive disorder, without psychotic features (HCC)  Mixed hyperlipidemia  Primary insomnia    Assessment and Plan Assessment & Plan Cerumen impaction with secondary left otitis externa Bilateral cerumen impaction with secondary otitis externa, more severe in the left ear with significant cerumen accumulation, pressure, and irritation. Possible infection indicated by bloody, inflamed ear canal. Likely exacerbated by excessive cerumen production and Debrox use. - irrigation performed in office today with much cerumen removed out of left ear and patient able to hear better.  - Administer ciprodex ear drops for 7 days to reduce inflammation and treat infection. - Use Debrox as needed for cerumen impaction.  Type 2 diabetes mellitus associated with hyperlipidemia Type 2 diabetes mellitus managed with metformin /jardiance /trulicity . Emphasized regular medication adherence for blood glucose control. - Continue metformin  as prescribed. - Perform A1c testing every 3 months. Last A1c was 9.0 on 04/30/24.   Hyperlipidemia Hyperlipidemia managed with atorvastatin . Irregular medication use may affect lipid control. - Encourage regular use of atorvastatin  as prescribed.  Insomnia Insomnia managed with amitriptyline , reported effective for sleep. - Continue amitriptyline  as prescribed.  Major depressive disorder Major depressive disorder managed with escitalopram . Multiple medication bottles from different pharmacies may cause confusion. - Continue escitalopram  as prescribed. - Highlight medication list to prevent duplication and ensure proper  adherence.  Overall discussed importance of taking medication regularly. I highlighted daily medications on AVS and listed as needed medications as such.   Follow up in 2 months for next A1C check or as needed.   Spent 35 minutes with patient in chart review, going over medications with patient, discussing how to take and treatment plan for recurrent cerumen impaction and new external ear infection.      Tomara Youngberg, PA-C

## 2024-06-04 ENCOUNTER — Other Ambulatory Visit (HOSPITAL_COMMUNITY): Payer: Self-pay

## 2024-06-07 ENCOUNTER — Other Ambulatory Visit (HOSPITAL_COMMUNITY): Payer: Self-pay

## 2024-06-07 ENCOUNTER — Ambulatory Visit: Admitting: Physician Assistant

## 2024-06-08 ENCOUNTER — Other Ambulatory Visit (HOSPITAL_COMMUNITY): Payer: Self-pay

## 2024-06-08 ENCOUNTER — Encounter (HOSPITAL_COMMUNITY): Payer: Self-pay

## 2024-06-24 MED FILL — Dulaglutide Soln Auto-injector 3 MG/0.5ML: SUBCUTANEOUS | 28 days supply | Qty: 2 | Fill #2 | Status: AC

## 2024-07-01 ENCOUNTER — Other Ambulatory Visit: Payer: Self-pay | Admitting: Urgent Care

## 2024-07-01 ENCOUNTER — Other Ambulatory Visit: Payer: Self-pay

## 2024-07-01 ENCOUNTER — Other Ambulatory Visit (HOSPITAL_COMMUNITY): Payer: Self-pay

## 2024-07-01 DIAGNOSIS — E119 Type 2 diabetes mellitus without complications: Secondary | ICD-10-CM

## 2024-07-01 DIAGNOSIS — E785 Hyperlipidemia, unspecified: Secondary | ICD-10-CM

## 2024-07-02 ENCOUNTER — Other Ambulatory Visit (HOSPITAL_COMMUNITY): Payer: Self-pay

## 2024-07-02 ENCOUNTER — Other Ambulatory Visit: Payer: Self-pay | Admitting: Physician Assistant

## 2024-07-02 DIAGNOSIS — R0981 Nasal congestion: Secondary | ICD-10-CM

## 2024-07-05 ENCOUNTER — Other Ambulatory Visit (HOSPITAL_COMMUNITY): Payer: Self-pay

## 2024-07-05 MED ORDER — FLUTICASONE PROPIONATE 50 MCG/ACT NA SUSP
2.0000 | Freq: Every day | NASAL | 0 refills | Status: DC
  Filled 2024-07-05: qty 16, 30d supply, fill #0

## 2024-07-14 ENCOUNTER — Other Ambulatory Visit (HOSPITAL_COMMUNITY): Payer: Self-pay

## 2024-07-14 MED ORDER — ALPRAZOLAM 2 MG PO TABS
2.0000 mg | ORAL_TABLET | Freq: Three times a day (TID) | ORAL | 0 refills | Status: DC
  Filled 2024-07-14: qty 90, 30d supply, fill #0

## 2024-07-15 ENCOUNTER — Other Ambulatory Visit: Payer: Self-pay

## 2024-07-19 ENCOUNTER — Telehealth: Payer: Self-pay

## 2024-07-19 NOTE — Telephone Encounter (Signed)
 Contacted patient in regards to previous office visit with Vermell Bologna on 06/01/2024. This appointment was to address our diabetes mellitus. During that visit we advised a 3 month follow-up was suggested for continuation of care. Advised I have taken notice pt has not set an appointment with PCP, Benton Gave, in regards to a diabetic follow-up. Patient advised he will return a call to our office when he is ready to schedule.

## 2024-07-22 ENCOUNTER — Other Ambulatory Visit: Payer: Self-pay

## 2024-07-22 MED FILL — Dulaglutide Soln Auto-injector 3 MG/0.5ML: SUBCUTANEOUS | 28 days supply | Qty: 2 | Fill #3 | Status: AC

## 2024-07-29 ENCOUNTER — Other Ambulatory Visit: Payer: Self-pay | Admitting: Physician Assistant

## 2024-07-29 ENCOUNTER — Other Ambulatory Visit: Payer: Self-pay | Admitting: Urgent Care

## 2024-07-29 DIAGNOSIS — R0981 Nasal congestion: Secondary | ICD-10-CM

## 2024-08-02 ENCOUNTER — Telehealth: Payer: Self-pay

## 2024-08-02 ENCOUNTER — Other Ambulatory Visit (HOSPITAL_COMMUNITY): Payer: Self-pay

## 2024-08-02 DIAGNOSIS — R0981 Nasal congestion: Secondary | ICD-10-CM

## 2024-08-02 MED ORDER — METFORMIN HCL ER 500 MG PO TB24
500.0000 mg | ORAL_TABLET | Freq: Two times a day (BID) | ORAL | 0 refills | Status: AC
  Filled 2024-08-02: qty 60, 30d supply, fill #0
  Filled 2024-09-04: qty 60, 30d supply, fill #1
  Filled 2024-10-04: qty 60, 30d supply, fill #2

## 2024-08-02 MED ORDER — FLUTICASONE PROPIONATE 50 MCG/ACT NA SUSP
2.0000 | Freq: Every day | NASAL | 0 refills | Status: AC
  Filled 2024-08-02: qty 16, 30d supply, fill #0

## 2024-08-02 NOTE — Telephone Encounter (Signed)
 Patient requesting a refill on his Flonase . Fill sent in also advised him Benton would like to f/u with him regarding his medications in a 1-2 wks patient states he would call back to get scheduled.

## 2024-08-02 NOTE — Telephone Encounter (Signed)
 Copied from CRM #[id number]. Topic: Clinical - Medication Question >> Aug 02, 2024  9:12 AM [name] wrote: Reason for CRM: Pt has medications that he does not know what he is currently taking .  He is confused about his medications and would like a nurse to call him back.  [phone]

## 2024-08-04 ENCOUNTER — Other Ambulatory Visit (HOSPITAL_COMMUNITY): Payer: Self-pay

## 2024-08-04 DIAGNOSIS — F411 Generalized anxiety disorder: Secondary | ICD-10-CM | POA: Diagnosis not present

## 2024-08-04 MED ORDER — ALPRAZOLAM 2 MG PO TABS
2.0000 mg | ORAL_TABLET | Freq: Three times a day (TID) | ORAL | 5 refills | Status: DC
  Filled 2024-08-19: qty 90, 30d supply, fill #0
  Filled 2024-09-19: qty 90, 30d supply, fill #1

## 2024-08-05 ENCOUNTER — Other Ambulatory Visit (HOSPITAL_COMMUNITY): Payer: Self-pay

## 2024-08-11 ENCOUNTER — Other Ambulatory Visit (HOSPITAL_COMMUNITY): Payer: Self-pay

## 2024-08-12 ENCOUNTER — Other Ambulatory Visit (HOSPITAL_COMMUNITY): Payer: Self-pay

## 2024-08-12 ENCOUNTER — Other Ambulatory Visit: Payer: Self-pay

## 2024-08-12 MED FILL — Dulaglutide Soln Auto-injector 3 MG/0.5ML: SUBCUTANEOUS | 28 days supply | Qty: 2 | Fill #4 | Status: CN

## 2024-08-13 MED FILL — Dulaglutide Soln Auto-injector 3 MG/0.5ML: SUBCUTANEOUS | 28 days supply | Qty: 2 | Fill #4 | Status: AC

## 2024-08-19 ENCOUNTER — Other Ambulatory Visit (HOSPITAL_COMMUNITY): Payer: Self-pay

## 2024-08-19 ENCOUNTER — Other Ambulatory Visit: Payer: Self-pay | Admitting: Urgent Care

## 2024-08-19 ENCOUNTER — Other Ambulatory Visit: Payer: Self-pay

## 2024-08-19 DIAGNOSIS — R0981 Nasal congestion: Secondary | ICD-10-CM

## 2024-08-19 DIAGNOSIS — E119 Type 2 diabetes mellitus without complications: Secondary | ICD-10-CM

## 2024-08-19 DIAGNOSIS — M5136 Other intervertebral disc degeneration, lumbar region with discogenic back pain only: Secondary | ICD-10-CM

## 2024-08-19 DIAGNOSIS — E785 Hyperlipidemia, unspecified: Secondary | ICD-10-CM

## 2024-08-19 NOTE — Telephone Encounter (Signed)
 Requesting rx rf of  Atorvastatin  10mg  tablet Last written 03/15/2024 Celcoxib 200mg  Last written 04/30/2024 Fluticasone  50 mcg  Last written 08/02/2024 - should have refills at pharmacy.  Last OV 09/30/2025[place] Kristopher Snyder Upcoming appt = none

## 2024-08-19 NOTE — Telephone Encounter (Signed)
 Please have pt schedule a follow up visit in office. He never followed up after our last visit. I can call in 30 days if needed... please advise

## 2024-08-19 NOTE — Telephone Encounter (Signed)
 Attempted call to patient requesting a return call to assist in scheduling an appointment.

## 2024-08-20 NOTE — Telephone Encounter (Signed)
 Patient scheduled for 08/27/2024 at 8am with [name] for medication follow up and refills. =kph

## 2024-08-27 ENCOUNTER — Ambulatory Visit: Admitting: Urgent Care

## 2024-09-06 ENCOUNTER — Other Ambulatory Visit (HOSPITAL_COMMUNITY): Payer: Self-pay

## 2024-09-09 MED FILL — Dulaglutide Soln Auto-injector 3 MG/0.5ML: SUBCUTANEOUS | 28 days supply | Qty: 2 | Fill #5 | Status: AC

## 2024-09-15 ENCOUNTER — Other Ambulatory Visit (HOSPITAL_COMMUNITY): Payer: Self-pay

## 2024-09-20 ENCOUNTER — Other Ambulatory Visit: Payer: Self-pay

## 2024-09-29 ENCOUNTER — Other Ambulatory Visit (HOSPITAL_COMMUNITY): Payer: Self-pay

## 2024-09-29 ENCOUNTER — Telehealth: Payer: Self-pay

## 2024-09-29 MED ORDER — LACTULOSE 10 GM/15ML PO SOLN
10.0000 g | Freq: Three times a day (TID) | ORAL | 0 refills | Status: DC
  Filled 2024-09-29: qty 240, 6d supply, fill #0

## 2024-09-29 MED ORDER — URSODIOL 300 MG PO CAPS
600.0000 mg | ORAL_CAPSULE | Freq: Three times a day (TID) | ORAL | 0 refills | Status: AC
  Filled 2024-09-29 – 2024-10-08 (×2): qty 180, 30d supply, fill #0

## 2024-09-29 MED ORDER — XIFAXAN 550 MG PO TABS
550.0000 mg | ORAL_TABLET | Freq: Two times a day (BID) | ORAL | 0 refills | Status: DC
  Filled 2024-09-29 – 2024-09-30 (×2): qty 42, 21d supply, fill #0

## 2024-09-29 MED ORDER — CLOTRIMAZOLE-BETAMETHASONE 1-0.05 % EX CREA
TOPICAL_CREAM | Freq: Two times a day (BID) | CUTANEOUS | 0 refills | Status: AC
  Filled 2024-09-29: qty 30, 30d supply, fill #0

## 2024-09-29 MED ORDER — FUROSEMIDE 20 MG PO TABS
20.0000 mg | ORAL_TABLET | Freq: Every day | ORAL | 1 refills | Status: AC
  Filled 2024-09-29: qty 30, 30d supply, fill #0

## 2024-09-29 MED ORDER — SPIRONOLACTONE 50 MG PO TABS
50.0000 mg | ORAL_TABLET | Freq: Every day | ORAL | 0 refills | Status: AC
  Filled 2024-09-29: qty 30, 30d supply, fill #0

## 2024-09-29 MED ORDER — PREDNISONE 20 MG PO TABS
ORAL_TABLET | ORAL | 0 refills | Status: AC
  Filled 2024-09-29: qty 63, 42d supply, fill #0

## 2024-09-29 NOTE — Telephone Encounter (Signed)
 Copied from CRM #[id number]. Topic: Clinical - Medication Prior Auth >> Sep 29, 2024  2:53 PM Jasmin G wrote: Reason for CRM: Kristopher Snyder needs pre authorization to refill rifaximin  (XIFAXAN ) 550 MG TABS.

## 2024-09-29 NOTE — Telephone Encounter (Signed)
 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 I had recommended he come in for follow up and has missed at least one, possibly more follow ups with me. He would need to be seen to get me to write this script.

## 2024-09-29 NOTE — Telephone Encounter (Signed)
 Per Epic this comes from a [place] provider. We can only do PA's for [facility].

## 2024-09-30 ENCOUNTER — Telehealth (HOSPITAL_COMMUNITY): Payer: Self-pay

## 2024-09-30 ENCOUNTER — Telehealth: Payer: Self-pay

## 2024-09-30 ENCOUNTER — Encounter (HOSPITAL_COMMUNITY): Payer: Self-pay

## 2024-09-30 ENCOUNTER — Other Ambulatory Visit: Payer: Self-pay

## 2024-09-30 ENCOUNTER — Telehealth (HOSPITAL_COMMUNITY): Payer: Self-pay | Admitting: Pharmacy Technician

## 2024-09-30 ENCOUNTER — Other Ambulatory Visit (HOSPITAL_COMMUNITY): Payer: Self-pay

## 2024-09-30 DIAGNOSIS — K7460 Unspecified cirrhosis of liver: Secondary | ICD-10-CM

## 2024-09-30 NOTE — Transitions of Care (Post Inpatient/ED Visit) (Signed)
" ° °  09/30/2024  Name: Kristopher Snyder MRN: [id number] DOB: 25-Sep-1970  Today's TOC FU Call Status: Today's TOC FU Call Status:: Successful TOC FU Call Completed TOC FU Call Complete Date: 09/30/24 (Call back from patient - [facility] RN explained TOC program and patient declined - TOC RN encouraged patient to schedule hospital follow up with PCP as soon as possible)  Patient's Name and Date of Birth confirmed. DOB, Name  Items Reviewed: schedule with PCP asap - to ensure referral to GI - patient reported he can't afford the Rifaximin . Message was sent to PCP, Benton Gave who asked [facility] RN to see if patient would be willing to have referral to SW - patient states all he needs is transportation resource because he is not supposed to drive agreed to SW referral - [facility] RN scheduled with Thersia Hoar 10/04/24 1pm. TOC RN also offered pharmacy referral for assistance with Rifaximin  and patient agreed. TOC RN revisited TOC Program and patient still declined stating he gets overwhelmed.    Shona Prow RN, CCM [place]  VBCI-Population Health RN Care Manager [phone]  "

## 2024-09-30 NOTE — Transitions of Care (Post Inpatient/ED Visit) (Signed)
" ° °  09/30/2024  Name: Kristopher Snyder MRN: [id number] DOB: Mar 13, 1971  Today's TOC FU Call Status: Today's TOC FU Call Status:: Unsuccessful Call (1st Attempt) Unsuccessful Call (1st Attempt) Date: 09/30/24  Attempted to reach the patient regarding the most recent Inpatient/ED visit.  Follow Up Plan: Additional outreach attempts will be made to reach the patient to complete the Transitions of Care (Post Inpatient/ED visit) call.   Shona Prow RN, CCM [place]  VBCI-Population Health RN Care Manager [phone]  "

## 2024-09-30 NOTE — Telephone Encounter (Signed)
 PA request has been Received. New Encounter has been or will be created for follow up. For additional info see Pharmacy Prior Auth telephone encounter from 09/30/24.

## 2024-09-30 NOTE — Progress Notes (Signed)
 Care Guide Pharmacy Note  09/30/2024 Name: Kristopher Snyder MRN: [id number] DOB: 13-Jan-1971  Referred By: Lowella Benton CROME, PA Reason for referral: Call Attempt #1 and Complex Care Management (Outreach to sch ref w/ pharm)   Kristopher Snyder is a 54 y.o. year old male who is a primary care patient of Crain, Whitney L, [place].  Kristopher Snyder was referred to the pharmacist for assistance related to: HLD  Successful contact was made with the patient to discuss pharmacy services including being ready for the pharmacist to call at least 5 minutes before the scheduled appointment time and to have medication bottles and any blood pressure readings ready for review. The patient agreed to meet with the pharmacist via telephone visit on 10/13/2024.  Kristopher Snyder [facility], [facility] Guide Direct Dial: [phone]  Fax: [phone]

## 2024-09-30 NOTE — Telephone Encounter (Signed)
 Pharmacy Patient Advocate Encounter   Received notification from Pt Calls Messages that prior authorization for Trulicity  3MG /0.5ML auto-injectors  is required/requested.   Insurance verification completed.   The patient is insured through [employer].   Per test claim: PA required; PA submitted to above mentioned insurance via Latent Key/confirmation #/EOC [id number] Status is pending

## 2024-09-30 NOTE — Telephone Encounter (Signed)
 Pharmacy Patient Advocate Encounter  Received notification from [employer] that Prior Authorization for  Trulicity  3MG /0.5ML auto-injectors  has been APPROVED from 09/30/24 to 09/30/25. Ran test claim, Copay is $0. This test claim was processed through [facility]- copay amounts may vary at other pharmacies due to pharmacy/plan contracts, or as the patient moves through the different stages of their insurance plan.   PA #/Case ID/Reference #: [id number]

## 2024-10-01 ENCOUNTER — Other Ambulatory Visit (HOSPITAL_COMMUNITY): Payer: Self-pay

## 2024-10-01 MED ORDER — XIFAXAN 550 MG PO TABS
550.0000 mg | ORAL_TABLET | Freq: Two times a day (BID) | ORAL | 0 refills | Status: DC
  Filled 2024-10-01: qty 6, 3d supply, fill #0

## 2024-10-01 MED ORDER — XIFAXAN 550 MG PO TABS
550.0000 mg | ORAL_TABLET | Freq: Two times a day (BID) | ORAL | 0 refills | Status: DC
  Filled 2024-10-01: qty 10, 5d supply, fill #0

## 2024-10-01 NOTE — Telephone Encounter (Signed)
 Patient scheduled.

## 2024-10-04 ENCOUNTER — Ambulatory Visit: Admitting: Urgent Care

## 2024-10-04 ENCOUNTER — Telehealth: Payer: Self-pay

## 2024-10-04 NOTE — Patient Instructions (Signed)
 Glendia Lunger - I am sorry I was unable to reach you today for our scheduled appointment. I work with Crain, Benton CROME, PA and am calling to support your healthcare needs. Please contact me at [phone] at your earliest convenience. I look forward to speaking with you soon.   Thank you,  Thersia Hoar, BSW, MHA [place]  [facility] Social Worker, Population Health [phone]

## 2024-10-05 ENCOUNTER — Ambulatory Visit: Admitting: Urgent Care

## 2024-10-05 ENCOUNTER — Other Ambulatory Visit (HOSPITAL_COMMUNITY): Payer: Self-pay

## 2024-10-05 ENCOUNTER — Encounter: Payer: Self-pay | Admitting: Urgent Care

## 2024-10-05 VITALS — BP 113/79 | HR 114 | Ht 73.0 in | Wt 257.0 lb

## 2024-10-05 DIAGNOSIS — M79674 Pain in right toe(s): Secondary | ICD-10-CM | POA: Insufficient documentation

## 2024-10-05 DIAGNOSIS — F1011 Alcohol abuse, in remission: Secondary | ICD-10-CM | POA: Diagnosis not present

## 2024-10-05 DIAGNOSIS — D692 Other nonthrombocytopenic purpura: Secondary | ICD-10-CM | POA: Diagnosis not present

## 2024-10-05 DIAGNOSIS — G629 Polyneuropathy, unspecified: Secondary | ICD-10-CM | POA: Diagnosis not present

## 2024-10-05 DIAGNOSIS — F32A Depression, unspecified: Secondary | ICD-10-CM

## 2024-10-05 DIAGNOSIS — Z09 Encounter for follow-up examination after completed treatment for conditions other than malignant neoplasm: Secondary | ICD-10-CM | POA: Diagnosis not present

## 2024-10-05 DIAGNOSIS — D696 Thrombocytopenia, unspecified: Secondary | ICD-10-CM | POA: Insufficient documentation

## 2024-10-05 DIAGNOSIS — K7031 Alcoholic cirrhosis of liver with ascites: Secondary | ICD-10-CM

## 2024-10-05 DIAGNOSIS — K8020 Calculus of gallbladder without cholecystitis without obstruction: Secondary | ICD-10-CM | POA: Insufficient documentation

## 2024-10-05 DIAGNOSIS — E1140 Type 2 diabetes mellitus with diabetic neuropathy, unspecified: Secondary | ICD-10-CM

## 2024-10-05 DIAGNOSIS — F419 Anxiety disorder, unspecified: Secondary | ICD-10-CM

## 2024-10-05 MED ORDER — RIFAXIMIN 550 MG PO TABS
550.0000 mg | ORAL_TABLET | Freq: Two times a day (BID) | ORAL | 0 refills | Status: AC
  Filled 2024-10-05: qty 42, 21d supply, fill #0

## 2024-10-05 MED ORDER — LACTULOSE 10 GM/15ML PO SOLN
10.0000 g | Freq: Three times a day (TID) | ORAL | 4 refills | Status: AC
  Filled 2024-10-05: qty 240, 6d supply, fill #0

## 2024-10-06 ENCOUNTER — Ambulatory Visit: Payer: Self-pay | Admitting: Urgent Care

## 2024-10-06 ENCOUNTER — Telehealth: Payer: Self-pay | Admitting: Pharmacy Technician

## 2024-10-06 ENCOUNTER — Other Ambulatory Visit: Payer: Self-pay

## 2024-10-06 DIAGNOSIS — K7031 Alcoholic cirrhosis of liver with ascites: Secondary | ICD-10-CM

## 2024-10-06 LAB — CMP14+EGFR
ALT: 155 [IU]/L — ABNORMAL HIGH (ref 0–44)
AST: 201 [IU]/L — ABNORMAL HIGH (ref 0–40)
Albumin: 3.3 g/dL — ABNORMAL LOW (ref 3.8–4.9)
Alkaline Phosphatase: 218 [IU]/L — ABNORMAL HIGH (ref 47–123)
BUN/Creatinine Ratio: 25 — ABNORMAL HIGH (ref 9–20)
BUN: 20 mg/dL (ref 6–24)
Bilirubin Total: 2.9 mg/dL — ABNORMAL HIGH (ref 0.0–1.2)
CO2: 18 mmol/L — ABNORMAL LOW (ref 20–29)
Calcium: 8.7 mg/dL (ref 8.7–10.2)
Chloride: 104 mmol/L (ref 96–106)
Creatinine, Ser: 0.8 mg/dL (ref 0.76–1.27)
Globulin, Total: 4 g/dL (ref 1.5–4.5)
Glucose: 129 mg/dL — ABNORMAL HIGH (ref 70–99)
Potassium: 4.5 mmol/L (ref 3.5–5.2)
Sodium: 137 mmol/L (ref 134–144)
Total Protein: 7.3 g/dL (ref 6.0–8.5)
eGFR: 106 mL/min/{1.73_m2}

## 2024-10-06 LAB — CBC WITH DIFFERENTIAL/PLATELET
Basophils Absolute: 0.1 10*3/uL (ref 0.0–0.2)
Basos: 1 %
EOS (ABSOLUTE): 0.1 10*3/uL (ref 0.0–0.4)
Eos: 1 %
Hematocrit: 43 % (ref 37.5–51.0)
Hemoglobin: 14.9 g/dL (ref 13.0–17.7)
Immature Grans (Abs): 0 10*3/uL (ref 0.0–0.1)
Immature Granulocytes: 0 %
Lymphocytes Absolute: 1.1 10*3/uL (ref 0.7–3.1)
Lymphs: 11 %
MCH: 37.9 pg — ABNORMAL HIGH (ref 26.6–33.0)
MCHC: 34.7 g/dL (ref 31.5–35.7)
MCV: 109 fL — ABNORMAL HIGH (ref 79–97)
Monocytes Absolute: 0.8 10*3/uL (ref 0.1–0.9)
Monocytes: 8 %
Neutrophils Absolute: 8 10*3/uL — ABNORMAL HIGH (ref 1.4–7.0)
Neutrophils: 79 %
Platelets: 160 10*3/uL (ref 150–450)
RBC: 3.93 x10E6/uL — ABNORMAL LOW (ref 4.14–5.80)
RDW: 13.1 % (ref 11.6–15.4)
WBC: 10 10*3/uL (ref 3.4–10.8)

## 2024-10-06 LAB — VITAMIN B12: Vitamin B-12: 1144 pg/mL (ref 232–1245)

## 2024-10-06 LAB — VITAMIN B1

## 2024-10-06 LAB — AMMONIA: Ammonia: 161 ug/dL (ref 40–200)

## 2024-10-06 LAB — MAGNESIUM: Magnesium: 2.1 mg/dL (ref 1.6–2.3)

## 2024-10-08 ENCOUNTER — Other Ambulatory Visit (HOSPITAL_COMMUNITY): Payer: Self-pay

## 2024-10-08 ENCOUNTER — Other Ambulatory Visit: Payer: Self-pay

## 2024-10-08 ENCOUNTER — Telehealth: Payer: Self-pay

## 2024-10-08 NOTE — Progress Notes (Unsigned)
 Complex Care Management Care Guide Note  10/08/2024 Name: Raijon Lindfors MRN: [id number] DOB: 23-Aug-1971  Leeman Johnsey is a 54 y.o. year old male who is a primary care patient of Crain, Whitney L, [place] and is actively engaged with the care management team. I reached out to [employer] by phone today to assist with re-scheduling  with the BSW.  Follow up plan: Unsuccessful telephone outreach attempt made. A HIPAA compliant phone message was left for the patient providing contact information and requesting a return call.  Doyce Razor [facility], [facility] Guide Direct Dial: [phone]  Fax: [phone]

## 2024-10-13 ENCOUNTER — Other Ambulatory Visit

## 2025-02-02 ENCOUNTER — Ambulatory Visit: Admitting: Urgent Care
